# Patient Record
Sex: Male | Born: 1935 | Marital: Married | Smoking: Former smoker
Health system: Southern US, Community
[De-identification: ages and names within clinical notes are randomized; demographics above are authoritative.]

## PROBLEM LIST (undated history)

## (undated) DIAGNOSIS — C801 Malignant (primary) neoplasm, unspecified: Secondary | ICD-10-CM

## (undated) DIAGNOSIS — C189 Malignant neoplasm of colon, unspecified: Secondary | ICD-10-CM

## (undated) DIAGNOSIS — D649 Anemia, unspecified: Secondary | ICD-10-CM

## (undated) DIAGNOSIS — R55 Syncope and collapse: Principal | ICD-10-CM

## (undated) DIAGNOSIS — N4289 Other specified disorders of prostate: Secondary | ICD-10-CM

## (undated) HISTORY — PX: COLOSTOMY: SHX63

## (undated) HISTORY — PX: COLECTOMY: SHX59

## (undated) HISTORY — PX: CIRCUMCISION: SUR203

## (undated) HISTORY — PX: COLONOSCOPY: SHX174

---

## 2007-05-15 ENCOUNTER — Ambulatory Visit: Payer: Self-pay | Admitting: Family Medicine

## 2014-02-01 DIAGNOSIS — R55 Syncope and collapse: Principal | ICD-10-CM

## 2014-02-01 HISTORY — DX: Syncope and collapse: R55

## 2014-02-19 ENCOUNTER — Inpatient Hospital Stay (HOSPITAL_COMMUNITY)
Admission: EM | Admit: 2014-02-19 | Discharge: 2014-02-23 | DRG: 312 | Disposition: A | Discharge status: Home / Self Care | Payer: Medicare Other | Attending: Internal Medicine | Admitting: Internal Medicine

## 2014-02-19 ENCOUNTER — Emergency Department (HOSPITAL_COMMUNITY): Payer: Medicare Other

## 2014-02-19 ENCOUNTER — Encounter (HOSPITAL_COMMUNITY): Payer: Self-pay | Admitting: Emergency Medicine

## 2014-02-19 DIAGNOSIS — D649 Anemia, unspecified: Secondary | ICD-10-CM | POA: Diagnosis present

## 2014-02-19 DIAGNOSIS — S270XXA Traumatic pneumothorax, initial encounter: Secondary | ICD-10-CM | POA: Diagnosis present

## 2014-02-19 DIAGNOSIS — Z79899 Other long term (current) drug therapy: Secondary | ICD-10-CM

## 2014-02-19 DIAGNOSIS — R509 Fever, unspecified: Secondary | ICD-10-CM

## 2014-02-19 DIAGNOSIS — D72820 Lymphocytosis (symptomatic): Secondary | ICD-10-CM

## 2014-02-19 DIAGNOSIS — D696 Thrombocytopenia, unspecified: Secondary | ICD-10-CM | POA: Diagnosis present

## 2014-02-19 DIAGNOSIS — T148XXA Other injury of unspecified body region, initial encounter: Secondary | ICD-10-CM | POA: Diagnosis present

## 2014-02-19 DIAGNOSIS — Y9289 Other specified places as the place of occurrence of the external cause: Secondary | ICD-10-CM

## 2014-02-19 DIAGNOSIS — Z800 Family history of malignant neoplasm of digestive organs: Secondary | ICD-10-CM

## 2014-02-19 DIAGNOSIS — Y990 Civilian activity done for income or pay: Secondary | ICD-10-CM

## 2014-02-19 DIAGNOSIS — S2249XA Multiple fractures of ribs, unspecified side, initial encounter for closed fracture: Secondary | ICD-10-CM

## 2014-02-19 DIAGNOSIS — Z87891 Personal history of nicotine dependence: Secondary | ICD-10-CM

## 2014-02-19 DIAGNOSIS — R296 Repeated falls: Secondary | ICD-10-CM | POA: Diagnosis present

## 2014-02-19 DIAGNOSIS — D72821 Monocytosis (symptomatic): Secondary | ICD-10-CM | POA: Diagnosis present

## 2014-02-19 DIAGNOSIS — Z8711 Personal history of peptic ulcer disease: Secondary | ICD-10-CM

## 2014-02-19 DIAGNOSIS — J209 Acute bronchitis, unspecified: Secondary | ICD-10-CM | POA: Diagnosis present

## 2014-02-19 DIAGNOSIS — J440 Chronic obstructive pulmonary disease with acute lower respiratory infection: Secondary | ICD-10-CM | POA: Diagnosis present

## 2014-02-19 DIAGNOSIS — S2232XA Fracture of one rib, left side, initial encounter for closed fracture: Secondary | ICD-10-CM

## 2014-02-19 DIAGNOSIS — Z8249 Family history of ischemic heart disease and other diseases of the circulatory system: Secondary | ICD-10-CM

## 2014-02-19 DIAGNOSIS — J111 Influenza due to unidentified influenza virus with other respiratory manifestations: Secondary | ICD-10-CM | POA: Diagnosis present

## 2014-02-19 DIAGNOSIS — D539 Nutritional anemia, unspecified: Secondary | ICD-10-CM | POA: Diagnosis present

## 2014-02-19 DIAGNOSIS — M25559 Pain in unspecified hip: Secondary | ICD-10-CM | POA: Diagnosis present

## 2014-02-19 DIAGNOSIS — N400 Enlarged prostate without lower urinary tract symptoms: Secondary | ICD-10-CM | POA: Diagnosis present

## 2014-02-19 DIAGNOSIS — R55 Syncope and collapse: Principal | ICD-10-CM | POA: Diagnosis present

## 2014-02-19 HISTORY — DX: Anemia, unspecified: D64.9

## 2014-02-19 HISTORY — DX: Syncope and collapse: R55

## 2014-02-19 HISTORY — DX: Other specified disorders of prostate: N42.89

## 2014-02-19 LAB — BASIC METABOLIC PANEL
BUN: 16 mg/dL (ref 6–23)
CALCIUM: 8.6 mg/dL (ref 8.4–10.5)
CO2: 22 meq/L (ref 19–32)
CREATININE: 0.95 mg/dL (ref 0.50–1.35)
Chloride: 99 mEq/L (ref 96–112)
GFR calc Af Amer: >90 mL/min (ref >90)
GFR calc non Af Amer: 79 mL/min — ABNORMAL LOW (ref >90)
Glucose, Bld: 148 mg/dL — ABNORMAL HIGH (ref 70–99)
Potassium: 4.2 mEq/L (ref 3.7–5.3)
Sodium: 136 mEq/L — ABNORMAL LOW (ref 137–147)

## 2014-02-19 LAB — CBC
HEMATOCRIT: 29.9 % — AB (ref 39.0–52.0)
Hemoglobin: 9.7 g/dL — ABNORMAL LOW (ref 13.0–17.0)
MCH: 36.6 pg — AB (ref 26.0–34.0)
MCHC: 32.4 g/dL (ref 30.0–36.0)
MCV: 112.8 fL — AB (ref 78.0–100.0)
PLATELETS: 71 10*3/uL — AB (ref 150–400)
RBC: 2.65 MIL/uL — AB (ref 4.22–5.81)
RDW: 15.4 % (ref 11.5–15.5)
WBC: 4 10*3/uL (ref 4.0–10.5)

## 2014-02-19 LAB — RETICULOCYTES
RBC.: 2.39 MIL/uL — ABNORMAL LOW (ref 4.22–5.81)
RETIC COUNT ABSOLUTE: 43 10*3/uL (ref 19.0–186.0)
Retic Ct Pct: 1.8 % (ref 0.4–3.1)

## 2014-02-19 LAB — I-STAT CG4 LACTIC ACID, ED: Lactic Acid, Venous: 2.65 mmol/L — ABNORMAL HIGH (ref 0.5–2.2)

## 2014-02-19 LAB — POC OCCULT BLOOD, ED: Fecal Occult Bld: NEGATIVE

## 2014-02-19 LAB — I-STAT TROPONIN, ED: TROPONIN I, POC: 0.01 ng/mL (ref 0.00–0.08)

## 2014-02-19 LAB — TROPONIN I

## 2014-02-19 MED ORDER — SODIUM CHLORIDE 0.9 % IV BOLUS (SEPSIS)
1000.0000 mL | Freq: Once | INTRAVENOUS | Status: AC
  Administered 2014-02-19: 1000 mL via INTRAVENOUS

## 2014-02-19 MED ORDER — SODIUM CHLORIDE 0.9 % IJ SOLN
3.0000 mL | Freq: Two times a day (BID) | INTRAMUSCULAR | Status: DC
  Administered 2014-02-20 – 2014-02-22 (×5): 3 mL via INTRAVENOUS

## 2014-02-19 MED ORDER — FINASTERIDE 5 MG PO TABS
5.0000 mg | ORAL_TABLET | Freq: Every day | ORAL | Status: DC
  Administered 2014-02-20 – 2014-02-23 (×4): 5 mg via ORAL
  Filled 2014-02-19 (×4): qty 1

## 2014-02-19 MED ORDER — LEVOFLOXACIN IN D5W 750 MG/150ML IV SOLN
750.0000 mg | INTRAVENOUS | Status: DC
  Administered 2014-02-19 – 2014-02-21 (×3): 750 mg via INTRAVENOUS
  Filled 2014-02-19 (×4): qty 150

## 2014-02-19 MED ORDER — ACETAMINOPHEN 325 MG PO TABS
650.0000 mg | ORAL_TABLET | Freq: Four times a day (QID) | ORAL | Status: DC | PRN
  Administered 2014-02-20 – 2014-02-23 (×7): 650 mg via ORAL
  Filled 2014-02-19 (×8): qty 2

## 2014-02-19 MED ORDER — ACETAMINOPHEN 500 MG PO TABS
1000.0000 mg | ORAL_TABLET | Freq: Once | ORAL | Status: AC
  Administered 2014-02-19: 1000 mg via ORAL
  Filled 2014-02-19: qty 2

## 2014-02-19 MED ORDER — ONDANSETRON HCL 4 MG PO TABS: 4.0000 mg | ORAL_TABLET | Freq: Four times a day (QID) | ORAL | Status: DC | PRN

## 2014-02-19 MED ORDER — SODIUM CHLORIDE 0.9 % IV SOLN
INTRAVENOUS | Status: AC
  Administered 2014-02-19 – 2014-02-20 (×3): via INTRAVENOUS

## 2014-02-19 MED ORDER — IOHEXOL 300 MG/ML  SOLN
100.0000 mL | Freq: Once | INTRAMUSCULAR | Status: AC | PRN
  Administered 2014-02-19: 100 mL via INTRAVENOUS

## 2014-02-19 MED ORDER — ONDANSETRON HCL 4 MG/2ML IJ SOLN: 4.0000 mg | Freq: Four times a day (QID) | INTRAMUSCULAR | Status: DC | PRN

## 2014-02-19 MED ORDER — OXYCODONE HCL 5 MG PO TABS
5.0000 mg | ORAL_TABLET | Freq: Four times a day (QID) | ORAL | Status: DC | PRN
  Administered 2014-02-19 – 2014-02-23 (×10): 5 mg via ORAL
  Filled 2014-02-19 (×10): qty 1

## 2014-02-19 MED ORDER — ACETAMINOPHEN 650 MG RE SUPP: 650.0000 mg | Freq: Four times a day (QID) | RECTAL | Status: DC | PRN

## 2014-02-19 MED ORDER — TAMSULOSIN HCL 0.4 MG PO CAPS
0.4000 mg | ORAL_CAPSULE | Freq: Every day | ORAL | Status: DC
  Administered 2014-02-20 – 2014-02-23 (×4): 0.4 mg via ORAL
  Filled 2014-02-19 (×4): qty 1

## 2014-02-19 NOTE — H&P (Signed)
[facility] History and Physical  Paul Trettin JYN:[id number] DOB: 1936-02-06 DOA: 02/19/2014  Referring physician: ER physician. PCP: No PCP Per Patient  Chief Complaint: Loss of consciousness.  HPI: Nicolas [name] is a 78 y.o. male with history of benign prosthetic hypertrophy had a brief episode of loss of consciousness at the [employer] where he works. Patient states that was attending a funeral and suddenly passed out without any premonition. Patient denies any chest pain shortness of breath palpitations focal deficits headache prior or after the fall. Patient did not have any tongue bite or incontinence of urine. After he fell he hit his left mid back. States he also hit his head but has not had any laceration. In the ER patient's EKG showed normal sinus rhythm. Patient was found to be mildly febrile. CBC shows anemia but stool for occult blood was negative. Since patient is complaining of left mid back pain patient had a CT abdomen pelvis which shows left lower rib fracture with air trapping and hematoma. Patient has been admitted for further observation. Patient states he has not had any syncopal episodes prior. Over the last couple of weeks patient has been having productive cough and 2 weeks ago he was treated for UTI and prostate symptoms with one week of antibiotics and finasteride was added. Patient's family states that he has been recently stressed after his nephew died.  Review of Systems: As presented in the history of presenting illness, rest negative.  Past Medical History  Diagnosis Date  . Prostate atrophy   . Anemia   . Anemia    Past Surgical History  Procedure Laterality Date  . Circumcision    . Colonoscopy     Social History:  reports that he has quit smoking. He does not have any smokeless tobacco history on file. He reports that he does not drink alcohol or use illicit drugs. Where does patient live home. Can patient participate in ADLs? Yes.  No Known  Allergies  Family History:  Family History  Problem Relation Age of Onset  . CAD Mother   . Pancreatic cancer Father   . Stroke Neg Hx       Prior to Admission medications   Medication Sig Start Date End Date Taking? Authorizing Provider  acetaminophen (TYLENOL) 500 MG tablet Take 1,000 mg by mouth every 6 (six) hours as needed for mild pain.   Yes Historical Provider, MD  finasteride (PROSCAR) 5 MG tablet Take 5 mg by mouth daily. 02/17/14  Yes Historical Provider, MD  tamsulosin (FLOMAX) 0.4 MG CAPS capsule Take 0.4 mg by mouth daily. 02/17/14  Yes Historical Provider, MD    Physical Exam: Filed Vitals:   02/19/14 1915 02/19/14 1930 02/19/14 2100 02/19/14 2124  BP: 111/41 120/46 117/57 137/54  Pulse: 87 81 83 85  Temp:    99.3 F (37.4 C)  TempSrc:    Oral  Resp: 19 24 21 20   Height:    5\' 11"  (1.803 m)  Weight:    94.938 kg (209 lb 4.8 oz)  SpO2: 91% 94% 95% 94%     General:  Well-developed and nourished.  Eyes: Anicteric no pallor.  ENT: No discharge from the ears eyes nose mouth.  Neck: No JVD or mass felt.  Cardiovascular: S1-S2 heard.  Respiratory: No rhonchi or crepitations.  Abdomen: Soft nontender bowel sounds present.  Skin: There is skin bruise on the left midback. Mildly tender no active discharge.  Musculoskeletal: Tenderness in the left back area.  Psychiatric: Appears  normal.  Neurologic: Alert and oriented to time place and person. Moves all extremities 5 x 5. No facial asymmetry. Tongue is midline.  Labs on Admission:  Basic Metabolic Panel:  Recent Labs Lab 02/19/14 1820  NA 136*  K 4.2  CL 99  CO2 22  GLUCOSE 148*  BUN 16  CREATININE 0.95  CALCIUM 8.6   Liver Function Tests: No results found for this basename: AST, ALT, ALKPHOS, BILITOT, PROT, ALBUMIN,  in the last 168 hours No results found for this basename: LIPASE, AMYLASE,  in the last 168 hours No results found for this basename: AMMONIA,  in the last 168  hours CBC:  Recent Labs Lab 02/19/14 1911  WBC 4.0  HGB 9.7*  HCT 29.9*  MCV 112.8*  PLT 71*   Cardiac Enzymes: No results found for this basename: CKTOTAL, CKMB, CKMBINDEX, TROPONINI,  in the last 168 hours  BNP (last 3 results) No results found for this basename: PROBNP,  in the last 8760 hours CBG: No results found for this basename: GLUCAP,  in the last 168 hours  Radiological Exams on Admission: Dg Chest 2 View  02/19/2014   CLINICAL DATA:  Syncopal episode.  Fell.  EXAM: CHEST  2 VIEW  COMPARISON:  None.  FINDINGS: The cardiac silhouette, mediastinal and hilar contours are within normal limits. The lungs are clear. No pneumothorax. The bony thorax is intact.  IMPRESSION: No acute cardiopulmonary findings and intact bony thorax.   Electronically Signed   By: Kalman Jewels M.D.   On: 02/19/2014 19:04   Ct Abdomen Pelvis W Contrast  02/19/2014   CLINICAL DATA:  No surrounding hematoma or hemo peritoneum. There are small scattered hepatic cysts. No biliary dilatation. The gallbladder is normal. No common bowel duct dilatation. The pancreas is normal. The spleen is normal. No definite splenic laceration. The adrenal glands and kidneys are unremarkable. No renal lacerations.  EXAM: CT ABDOMEN AND PELVIS WITH CONTRAST  TECHNIQUE: Multidetector CT imaging of the abdomen and pelvis was performed using the standard protocol following bolus administration of intravenous contrast.  CONTRAST:  163mL OMNIPAQUE IOHEXOL 300 MG/ML  SOLN  COMPARISON:  None.  FINDINGS: The lung bases demonstrate bilateral pleural effusions and patchy atelectasis. No definite pneumothorax. Right lower lobe airspace process with peribronchial thickening. Could not exclude aspiration or pulmonary contusion. The heart is normal in size. No pericardial effusion.  There are multiple left posterior rib fractures. This involves the ninth, tenth, eleventh and twelfth ribs. There is air in the soft tissues along with hematoma  and soft tissue swelling. This could be due to an open laceration/penetrating wound or a pneumothorax that has sealed off.  There is an abnormality involving the posterior aspect of the right hepatic lobe. This is most likely 18 congenital defect or fold in the liver air. It falls along with the diaphragm and is a small amount of fat interposed. I do not think this is a liver laceration in is no subcapsular hematoma or intraperitoneal blood.  The stomach, duodenum, small bowel and colon are grossly normal. No mesenteric or retroperitoneal mass, adenopathy or hematoma. She aorta and branch vessels are normal. The major venous structures are patent.  The prostate gland is enlarged. The bladder appears normal. The seminal vesicles are normal. No pelvic mass, adenopathy or hematoma.  The bony pelvis is intact. Moderate SI joint degenerative changes. No lumbar spine compression fractures or spinous process fractures.  IMPRESSION: 1. Left lower posterior rib fractures with associated chest wall  hematoma. Air in the soft tissues may be due to a laceration, penetrating wound or pneumothorax that has sealed off. There is an associated left pleural effusion. 2. No acute intra-abdominal/pelvic injury. 3. 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 4. Prostate gland enlargement.   Electronically Signed   By: Kalman Jewels M.D.   On: 02/19/2014 20:25    EKG: Independently reviewed. Normal sinus rhythm.  Assessment/Plan Principal Problem:   Syncope Active Problems:   Hematoma   Fever   Macrocytic anemia   1. Syncope - cause not clear. Given the sudden onset and lack of prodrome should rule out arrhythmias. Closely monitor in telemetry. Check 2-D echo. Check orthostatics. CT head is pending. 2. Hematoma with left rib fracture and possible air-trapping - I have discussed with pulmonologist Dr. Lamonte Sakai. Dr. Lamonte Sakai has advised to get a CT chest and  closely observe. Pain medications. 3. Fever - probably respiratory. Check influenza PCR. Check UA. Pending blood cultures. Patient has been empirically placed on Levaquin. 4. Anemia, macrocytic - check anemia panel. Patient states he has been chronically anemic. Stool for occult blood negative. Patient has had previous history of peptic ulcer disease. 5. BPH - continue present medications.    Code Status: Full code.  Family Communication: Patient's wife at the bedside.  Disposition Plan: Admit for observation.    Stillman Buenger N. [facility] Pager [phone].  If 7PM-7AM, please contact night-coverage [email or web address] Password [facility] 02/19/2014, 10:32 PM

## 2014-02-19 NOTE — ED Notes (Addendum)
Report called to unit 3W.

## 2014-02-19 NOTE — ED Notes (Signed)
Patient given a meal bag. 

## 2014-02-19 NOTE — Progress Notes (Signed)
ANTIBIOTIC CONSULT NOTE - INITIAL  Pharmacy Consult for Levofloxacin Indication: Bronchitis/Sinusitis  No Known Allergies  Patient Measurements: Height: 5\' 11"  (180.3 cm) Weight: 209 lb 4.8 oz (94.938 kg) IBW/kg (Calculated) : 75.3  Vital Signs: Temp: 99.3 F (37.4 C) (03/19 2124) Temp src: Oral (03/19 2124) BP: 137/54 mmHg (03/19 2124) Pulse Rate: 85 (03/19 2124) Intake/Output from previous day:   Intake/Output from this shift: Total I/O In: -  Out: 150 [Urine:150]  Labs:  Recent Labs  02/19/14 1820 02/19/14 1911  WBC  --  4.0  HGB  --  9.7*  PLT  --  71*  CREATININE 0.95  --    Estimated Creatinine Clearance: 77.8 ml/min (by C-G formula based on Cr of 0.95). No results found for this basename: VANCOTROUGH, VANCOPEAK, VANCORANDOM, GENTTROUGH, GENTPEAK, GENTRANDOM, TOBRATROUGH, TOBRAPEAK, TOBRARND, AMIKACINPEAK, AMIKACINTROU, AMIKACIN,  in the last 72 hours   Microbiology: No results found for this or any previous visit (from the past 720 hour(s)).  Medical History: Past Medical History  Diagnosis Date  . Prostate atrophy   . Anemia   . Anemia     Medications:  Prescriptions prior to admission  Medication Sig Dispense Refill  . acetaminophen (TYLENOL) 500 MG tablet Take 1,000 mg by mouth every 6 (six) hours as needed for mild pain.      . finasteride (PROSCAR) 5 MG tablet Take 5 mg by mouth daily.      . tamsulosin (FLOMAX) 0.4 MG CAPS capsule Take 0.4 mg by mouth daily.       Assessment: 78 yo M admitted 02/19/2014  With syncope.  Pharmacy consulted to dose levofloxacin for bronchitis/sinusitis  Goal of Therapy:  Renal adjustment of antibiotics.  Plan:  1. Levofloxacin 750 mg IV q24h 2. Follow up SCr, UOP, cultures, clinical course and adjust as clinically indicated.   Thank you for allowing pharmacy to be a part of this patients care team.  Rowe Robert Pharm.D., BCPS Clinical Pharmacist 02/19/2014 10:41 PM Pager: [phone] Phone: [phone]

## 2014-02-19 NOTE — ED Provider Notes (Signed)
CSN: [id number]     Arrival date & time 02/19/14  1801 History   First MD Initiated Contact with Patient 02/19/14 1801     No chief complaint on file.    (Consider location/radiation/quality/duration/timing/severity/associated sxs/prior Treatment) Patient is a 78 y.o. male presenting with syncope. The history is provided by the patient.  Loss of Consciousness Episode history:  Single Most recent episode:  Today Duration:  1 minute Timing: once. Progression:  Resolved Chronicity:  New Context comment:  Prolonged standing Witnessed: yes   Relieved by:  Nothing Worsened by:  Nothing tried Associated symptoms: fever   Associated symptoms: no chest pain and no shortness of breath     Past Medical History  Diagnosis Date  . Prostate atrophy   Med Hx - BPH  History reviewed. No pertinent past surgical history.No prior surgeries History reviewed. No pertinent family history. History  Substance Use Topics  . Smoking status: Former Research scientist (life sciences)  . Smokeless tobacco: Not on file  . Alcohol Use: No    Review of Systems  Constitutional: Positive for fever. Negative for chills.  Respiratory: Negative for cough and shortness of breath.   Cardiovascular: Positive for syncope. Negative for chest pain and leg swelling.  All other systems reviewed and are negative.      Allergies  Review of patient's allergies indicates not on file.  Home Medications  No current outpatient prescriptions on file. There were no vitals taken for this visit. Physical Exam  Nursing note and vitals reviewed. Constitutional: He is oriented to person, place, and time. He appears well-developed and well-nourished. No distress.  HENT:  Head: Normocephalic and atraumatic.  Mouth/Throat: No oropharyngeal exudate.  Eyes: EOM are normal. Pupils are equal, round, and reactive to light.  Neck: Normal range of motion. Neck supple.  Cardiovascular: Normal rate and regular rhythm.  Exam reveals no friction rub.     No murmur heard. Pulmonary/Chest: Effort normal and breath sounds normal. No respiratory distress. He has no wheezes. He has no rales.  Abdominal: Soft. He exhibits no distension. There is tenderness (L flank). There is no rebound.  Musculoskeletal: Normal range of motion. He exhibits no edema.       Arms: Neurological: He is alert and oriented to person, place, and time. No cranial nerve deficit. He exhibits normal muscle tone. Coordination normal.  Skin: No rash noted. He is not diaphoretic.    ED Course  Procedures (including critical care time) Labs Review Labs Reviewed - No data to display Imaging Review Dg Chest 2 View  02/19/2014   CLINICAL DATA:  Syncopal episode.  Fell.  EXAM: CHEST  2 VIEW  COMPARISON:  None.  FINDINGS: The cardiac silhouette, mediastinal and hilar contours are within normal limits. The lungs are clear. No pneumothorax. The bony thorax is intact.  IMPRESSION: No acute cardiopulmonary findings and intact bony thorax.   Electronically Signed   By: Kalman Jewels M.D.   On: 02/19/2014 19:04     EKG Interpretation   Date/Time:  Thursday February 19 2014 18:13:59 EDT Ventricular Rate:  91 PR Interval:  166 QRS Duration: 85 QT Interval:  343 QTC Calculation: 422 R Axis:   20 Text Interpretation:  Sinus rhythm Abnormal R-wave progression, early  transition No prior to compare to Confirmed by Mingo Amber  MD, Wallace Ridge ([id number]) on  02/19/2014 6:26:34 PM      MDM   Final diagnoses:  Syncope  Fracture of rib of left side    69M s/p syncopal episode with  LOC for 1 minute. Was working, standing beside a gravestone. 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 L side hit footstone in the [place]. No prior medical problems. Patient here with fever, 100.9, normal BP. O2 sat 91% with EMS, 94% here on room air.  Neurologically intact, normal cranial nerves, normal strength and sensation.  EKG without ischemia. Will obtain labs. Patient markedly tender on L side s/p  fall. Will CT abdomen/pelvis. Will admit for syncope. CBC shows Hgb of 9.7, thrombocytopenia. Blood cultures drawn for fever. Patient reported hx of prior anemia, followed by the [place]. Dr. Hal Hope admitting. Hemoccult negative. Abrasion on back, tetanus UTD.  I have reviewed all labs and imaging and considered them in my medical decision making.   Osvaldo Shipper, MD 02/19/14 2040

## 2014-02-19 NOTE — ED Notes (Signed)
Patient arrived via [place] from the [place] where he had a syncopal episode lasting apprx a minute falling and hitting his lower left back on a headstone. Denies hitting his head or neck area. Patient has an abrasion to his lower left back. A/O, VSS.

## 2014-02-20 ENCOUNTER — Inpatient Hospital Stay (HOSPITAL_COMMUNITY): Payer: Medicare Other

## 2014-02-20 ENCOUNTER — Encounter (HOSPITAL_COMMUNITY): Payer: Self-pay | Admitting: Radiology

## 2014-02-20 DIAGNOSIS — S270XXA Traumatic pneumothorax, initial encounter: Secondary | ICD-10-CM

## 2014-02-20 DIAGNOSIS — C9110 Chronic lymphocytic leukemia of B-cell type not having achieved remission: Secondary | ICD-10-CM

## 2014-02-20 DIAGNOSIS — S2249XA Multiple fractures of ribs, unspecified side, initial encounter for closed fracture: Secondary | ICD-10-CM | POA: Diagnosis present

## 2014-02-20 DIAGNOSIS — S2239XA Fracture of one rib, unspecified side, initial encounter for closed fracture: Secondary | ICD-10-CM

## 2014-02-20 DIAGNOSIS — J111 Influenza due to unidentified influenza virus with other respiratory manifestations: Secondary | ICD-10-CM | POA: Diagnosis present

## 2014-02-20 DIAGNOSIS — I517 Cardiomegaly: Secondary | ICD-10-CM

## 2014-02-20 DIAGNOSIS — D61818 Other pancytopenia: Secondary | ICD-10-CM

## 2014-02-20 DIAGNOSIS — D649 Anemia, unspecified: Secondary | ICD-10-CM | POA: Diagnosis present

## 2014-02-20 DIAGNOSIS — J1189 Influenza due to unidentified influenza virus with other manifestations: Secondary | ICD-10-CM

## 2014-02-20 LAB — URINALYSIS, ROUTINE W REFLEX MICROSCOPIC
Bilirubin Urine: NEGATIVE
Glucose, UA: NEGATIVE mg/dL
Ketones, ur: NEGATIVE mg/dL
Leukocytes, UA: NEGATIVE
Nitrite: NEGATIVE
Protein, ur: NEGATIVE mg/dL
SPECIFIC GRAVITY, URINE: 1.02 (ref 1.005–1.030)
UROBILINOGEN UA: 1 mg/dL (ref 0.0–1.0)
pH: 5 (ref 5.0–8.0)

## 2014-02-20 LAB — CBC WITH DIFFERENTIAL/PLATELET
BASOS ABS: 0 10*3/uL (ref 0.0–0.1)
BLASTS: 0 %
Band Neutrophils: 21 % — ABNORMAL HIGH (ref 0–10)
Basophils Relative: 0 % (ref 0–1)
Eosinophils Absolute: 0 10*3/uL (ref 0.0–0.7)
Eosinophils Relative: 0 % (ref 0–5)
HCT: 28.2 % — ABNORMAL LOW (ref 39.0–52.0)
Hemoglobin: 9.2 g/dL — ABNORMAL LOW (ref 13.0–17.0)
Lymphocytes Relative: 5 % — ABNORMAL LOW (ref 12–46)
Lymphs Abs: 0.4 10*3/uL — ABNORMAL LOW (ref 0.7–4.0)
MCH: 36.5 pg — ABNORMAL HIGH (ref 26.0–34.0)
MCHC: 32.6 g/dL (ref 30.0–36.0)
MCV: 111.9 fL — ABNORMAL HIGH (ref 78.0–100.0)
MYELOCYTES: 0 %
Metamyelocytes Relative: 0 %
Monocytes Absolute: 5.4 10*3/uL — ABNORMAL HIGH (ref 0.1–1.0)
Monocytes Relative: 63 % — ABNORMAL HIGH (ref 3–12)
NEUTROS ABS: 2.8 10*3/uL (ref 1.7–7.7)
NEUTROS PCT: 11 % — AB (ref 43–77)
NRBC: 0 /100{WBCs}
PLATELETS: 73 10*3/uL — AB (ref 150–400)
PROMYELOCYTES ABS: 0 %
RBC: 2.52 MIL/uL — ABNORMAL LOW (ref 4.22–5.81)
RDW: 15.7 % — ABNORMAL HIGH (ref 11.5–15.5)
Smear Review: DECREASED
WBC: 8.6 10*3/uL (ref 4.0–10.5)

## 2014-02-20 LAB — INFLUENZA PANEL BY PCR (TYPE A & B)
H1N1 flu by pcr: NOT DETECTED
INFLBPCR: POSITIVE — AB
Influenza A By PCR: NEGATIVE

## 2014-02-20 LAB — COMPREHENSIVE METABOLIC PANEL
ALT: 11 U/L (ref 0–53)
AST: 23 U/L (ref 0–37)
Albumin: 3 g/dL — ABNORMAL LOW (ref 3.5–5.2)
Alkaline Phosphatase: 60 U/L (ref 39–117)
BILIRUBIN TOTAL: 0.8 mg/dL (ref 0.3–1.2)
BUN: 12 mg/dL (ref 6–23)
CHLORIDE: 99 meq/L (ref 96–112)
CO2: 22 meq/L (ref 19–32)
CREATININE: 0.76 mg/dL (ref 0.50–1.35)
Calcium: 7.9 mg/dL — ABNORMAL LOW (ref 8.4–10.5)
GFR calc Af Amer: >90 mL/min (ref >90)
GFR, EST NON AFRICAN AMERICAN: 86 mL/min — AB (ref >90)
GLUCOSE: 109 mg/dL — AB (ref 70–99)
Potassium: 3.8 mEq/L (ref 3.7–5.3)
SODIUM: 134 meq/L — AB (ref 137–147)
Total Protein: 6.8 g/dL (ref 6.0–8.3)

## 2014-02-20 LAB — TROPONIN I: Troponin I: <0.3 ng/mL (ref <0.30)

## 2014-02-20 LAB — LACTIC ACID, PLASMA: LACTIC ACID, VENOUS: 1.2 mmol/L (ref 0.5–2.2)

## 2014-02-20 LAB — FERRITIN: Ferritin: 359 ng/mL — ABNORMAL HIGH (ref 22–322)

## 2014-02-20 LAB — LACTATE DEHYDROGENASE: LDH: 269 U/L — ABNORMAL HIGH (ref 94–250)

## 2014-02-20 LAB — URINE MICROSCOPIC-ADD ON

## 2014-02-20 LAB — VITAMIN B12: Vitamin B-12: 383 pg/mL (ref 211–911)

## 2014-02-20 LAB — PATHOLOGIST SMEAR REVIEW

## 2014-02-20 LAB — IRON AND TIBC
IRON: 15 ug/dL — AB (ref 42–135)
Saturation Ratios: 7 % — ABNORMAL LOW (ref 20–55)
TIBC: 204 ug/dL — ABNORMAL LOW (ref 215–435)
UIBC: 189 ug/dL (ref 125–400)

## 2014-02-20 LAB — FOLATE: FOLATE: 14.5 ng/mL

## 2014-02-20 LAB — HAPTOGLOBIN: Haptoglobin: 107 mg/dL (ref 45–215)

## 2014-02-20 MED ORDER — HYDROCOD POLST-CHLORPHEN POLST 10-8 MG/5ML PO LQCR
5.0000 mL | Freq: Two times a day (BID) | ORAL | Status: DC | PRN
  Administered 2014-02-20: 5 mL via ORAL
  Filled 2014-02-20: qty 5

## 2014-02-20 MED ORDER — IPRATROPIUM-ALBUTEROL 0.5-2.5 (3) MG/3ML IN SOLN
3.0000 mL | RESPIRATORY_TRACT | Status: DC
Start: 2014-02-20 — End: 2014-02-20

## 2014-02-20 MED ORDER — IOHEXOL 350 MG/ML SOLN
100.0000 mL | Freq: Once | INTRAVENOUS | Status: AC | PRN
  Administered 2014-02-20: 100 mL via INTRAVENOUS

## 2014-02-20 MED ORDER — OSELTAMIVIR PHOSPHATE 75 MG PO CAPS
75.0000 mg | ORAL_CAPSULE | Freq: Two times a day (BID) | ORAL | Status: DC
  Administered 2014-02-20 – 2014-02-23 (×7): 75 mg via ORAL
  Filled 2014-02-20 (×8): qty 1

## 2014-02-20 MED ORDER — IPRATROPIUM-ALBUTEROL 0.5-2.5 (3) MG/3ML IN SOLN: 3.0000 mL | RESPIRATORY_TRACT | Status: DC

## 2014-02-20 MED ORDER — ALBUTEROL SULFATE (2.5 MG/3ML) 0.083% IN NEBU
INHALATION_SOLUTION | RESPIRATORY_TRACT | Status: AC
  Filled 2014-02-20: qty 3

## 2014-02-20 NOTE — Care Management (Signed)
1614 02-20-14 CM did call the [facility] and left Voice mail to make the [place] aware that the pt is here. No further needs from CM at this time. Bethena Roys, RN,BSN [phone]

## 2014-02-20 NOTE — Progress Notes (Signed)
UR Completed Nevaeh Korte Graves-Bigelow, RN,BSN [phone]  

## 2014-02-20 NOTE — Consult Note (Signed)
Nicolas [name]  Telephone:([phone]) Nicolas [name] NOTE  Nicolas [name]                                MR#: [id number]  DOB: 24-Jul-1936                       CSN#: [id number]  Referring MD: [facility]  Teaching Service  Dr.    Darl Householder MD: Dr. Tana Coast  Reason for Consult: Atypical lymphocytes, monocytosis   RJJ:OACZYS Nicolas [name] is a 78 y.o. male asked to see for evaluation of pancytopenia.  He was admitted on 02/19/14 after experiencing a syncopal episode while working at [employer] with subsequent injury to his lower left back,  trauma on a headstone resulting in loss of consciousness for about 1 minute. This event occurred following treatment for a urinary tract infection about 2 weeks ago with oral antibiotic for 1 week. CT of the head was negative. Additional workup including a CBC revealed an H/H of 9.7/29.9, WBC of 4.0 and platelets of 71k. MCV was elevated at 112.8. Retic was 43 manual. Moderate Hb was seen min UA. Cr and bili were normal.   Today, his laboratory values are as follows: H/H 9.2/28.2, WBC 8.6 and platelets 73k. MCV is 111.9. Mono was 5.4 absolute, ANC 2.8 and lymphs 0.4. There was a mild left shift was noted. Atypical lymphocytes were seen in smear. B12 was 383. Folic [name] and  Iron and TIBC pending . Ferritin 359 (?chronic)  Guaiac was negative. PCR was positive for Influenza B for which Tamiflu was initiated. homocysteine, methylmalonic acid, haptoglobin, LDH, intrinsic factor are pending. Labs from [facility] on November 25 2013 revealed WBC of 2.24, H/H of 11.7/36.8, MCV 113.9, Plt of 130 with 5 bands, 36 lymphs and 43 mono with 2+ Macroctyosis.   CT angio of the chest on 3/20 with contrast is negative for PE, but acute fractures of the left posterior 9th through 12th ribs with associated soft tissue gas and hematoma within the posterior left chest wall, not significantly changed relative to previous study. Small left-sided pneumothorax.  Moderate left with < right pleural effusion with associated bibasilar atelectasis and patchy and nodular opacities within the inferior right upper and right lower lobes and upper lobe predominant centrilobular emphysema. In addition, CT of the abdomen and pelvis with contrast was negative for acute intrapelvic injury.  Denies risk factors for HIV or hepatitis. No hemoptysis or epistaxis. No macroscopic blood in urine or in stool. No family history of hematological disorders. No gum bleed. No epistaxis or hemoptysis. Denies easy bruising. No  weight loss.  He was not on  ASA or NSAIDs.prior to admission.  No  Lovenox initiated in hospital.No family history of hematological disorders. He reports being followed by Dr. Briscoe Deutscher of [facility] hematology oncology.  He was last seen by Dr. Eugenie Birks in December/January, he reports with recommended observation for his blood disorder.  He could not recall the exact diagnosis.  He does however reports a bone marrow biopsy about 4-5 years ago.   We were kindly asked to see the patient with recommendations.   PMH:  Past Medical History  Diagnosis Date  . Prostate atrophy   . Anemia   . Anemia   . Syncope and collapse 02/2014    Surgeries:  Past Surgical History  Procedure Laterality Date  . Circumcision    .  Colonoscopy      Allergies: No Known Allergies  Medications:   Prior to Admission:  Prescriptions prior to admission  Medication Sig Dispense Refill  . acetaminophen (TYLENOL) 500 MG tablet Take 1,000 mg by mouth every 6 (six) hours as needed for mild pain.      . finasteride (PROSCAR) 5 MG tablet Take 5 mg by mouth daily.      . tamsulosin (FLOMAX) 0.4 MG CAPS capsule Take 0.4 mg by mouth daily.       Scheduled Meds: . albuterol      . finasteride  5 mg Oral Daily  . levofloxacin (LEVAQUIN) IV  750 mg Intravenous Q24H  . oseltamivir  75 mg Oral BID  . sodium chloride  3 mL Intravenous Q12H  . tamsulosin  0.4 mg Oral Daily    Continuous Infusions: . sodium chloride 125 mL/hr at 02/20/14 1001   PRN Meds:.acetaminophen, acetaminophen, chlorpheniramine-HYDROcodone, ondansetron (ZOFRAN) IV, ondansetron, oxyCODONE WER:[id number], acetaminophen, chlorpheniramine-HYDROcodone, ondansetron (ZOFRAN) IV, ondansetron, oxyCODONE  ROS: Constitutional: Positive for weight loss. Positive for fever up to 102, no , chills or  night sweats. Negative for  fatigue.  Eyes: Negative for blurred vision and double vision.  Respiratory: Negative for cough. No hemoptysis. No shortness of breath. No pleuritic chest pain.  Cardiovascular: Negative for chest pain. No palpitations.  GI: Negative for  nausea, vomiting, diarrhea or constipation. No change in bowel caliber. No  Melena or hematochezia. No abdominal pain.  GU: Negative for hematuria. No loss of urinary control.No urinary retention. Skin: Negative for itching. No rash. No petechia. No easy bruising.  Neurological: No motor or sensory deficits. Musculoskeletal: Left chest wall tenderness post fall.  Psych: patient dealing with loss of nephew last week, in great deal of stress.   Family History:    Family History  Problem Relation Age of Onset  . CAD Mother   . Pancreatic cancer Father   . Stroke Neg Hx     No family history of hematological  disorders.  Social History: .Married.  Lives in [place], [place]. 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  No alcohol or use illicit drugs   Physical Exam    Filed Vitals:   02/20/14 0542  BP: 137/52  Pulse: 91  Temp: 102.9 F (39.4 C)  Resp: 30    General: 78 y.o.white  male  in no acute distress A. and O. x3  well-developed and well-nourished.  HEENT: Normocephalic, atraumatic, PERRLA. Oral cavity without thrush or lesions. Neck supple. no thyromegaly, no cervical or supraclavicular adenopathy  Lungs mild decreased breath sounds at the  base. No wheezing,expiratory  rhonchi , no  rales.  No axillary masses. Left back bruising and tenderness post fall..  Breasts: not examined. Cardiac regular rate and rhythm, no murmur , rubs or gallops Abdomen soft nontender , bowel sounds x4. No hepatosplenomegaly. No masses palpable.  GU/rectal: deferred. Extremities no clubbing cyanosis or edema. No   petechial rash. No inguinal lymph nodes palpable.  Musculoskeletal: left flank tenderness with abrasion in the area after fall. Neuro: Non Focal.   Labs:    Recent Labs Lab 02/19/14 1911 02/20/14 0559  WBC 4.0 8.6  HGB 9.7* 9.2*  HCT 29.9* 28.2*  PLT 71* 73*  MCV 112.8* 111.9*  MCH 36.6* 36.5*  MCHC 32.4 32.6  RDW 15.4 15.7*  LYMPHSABS  --  0.4*  MONOABS  --  5.4*  EOSABS  --  0.0  BASOSABS  --  0.0       Recent Labs Lab 02/19/14 1820 02/20/14 0559  NA 136* 134*  K 4.2 3.8  CL 99 99  CO2 22 22  GLUCOSE 148* 109*  BUN 16 12  CREATININE 0.95 0.76  CALCIUM 8.6 7.9*  AST  --  23  ALT  --  11  ALKPHOS  --  60  BILITOT  --  0.8        Component Value Date/Time   BILITOT 0.8 02/20/2014 0559     Anemia panel:   Recent Labs  02/19/14 2254 02/20/14 0559  VITAMINB12  --  383  FOLATE  --  14.5  FERRITIN  --  359*  RETICCTPCT 1.8  --     Urinalysis    Component Value Date/Time   COLORURINE YELLOW 02/20/2014 0201   APPEARANCEUR CLEAR 02/20/2014 0201   LABSPEC 1.020 02/20/2014 0201   PHURINE 5.0 02/20/2014 0201   GLUCOSEU NEGATIVE 02/20/2014 0201   HGBUR MODERATE* 02/20/2014 0201   BILIRUBINUR NEGATIVE 02/20/2014 0201   KETONESUR NEGATIVE 02/20/2014 0201   PROTEINUR NEGATIVE 02/20/2014 0201   UROBILINOGEN 1.0 02/20/2014 0201   NITRITE NEGATIVE 02/20/2014 0201   LEUKOCYTESUR NEGATIVE 02/20/2014 0201    Drugs of Abuse  No results found for this basename: labopia, cocainscrnur, labbenz, amphetmu, thcu, labbarb      Imaging Studies:  Dg Chest 2 View  02/19/2014   CLINICAL DATA:  Syncopal episode.  Fell.  EXAM: CHEST  2 VIEW  COMPARISON:  None.   FINDINGS: The cardiac silhouette, mediastinal and hilar contours are within normal limits. The lungs are clear. No pneumothorax. The bony thorax is intact.  IMPRESSION: No acute cardiopulmonary findings and intact bony thorax.   Electronically Signed   By: Kalman Jewels M.D.   On: 02/19/2014 19:04   Ct Head Wo Contrast  02/20/2014   CLINICAL DATA:  Syncope  EXAM: CT HEAD WITHOUT CONTRAST  TECHNIQUE: Contiguous axial images were obtained from the base of the skull through the vertex without intravenous contrast.  COMPARISON:  None.  FINDINGS: Mild atrophy with chronic microvascular ischemic changes are present. Vascular calcifications present within the carotid siphons bilaterally.  There is no acute intracranial hemorrhage or infarct. No mass lesion or midline shift. Gray-white matter differentiation is well maintained. Ventricles are normal in size without evidence of hydrocephalus. No extra-axial fluid collection.  The calvarium is intact.  Orbital soft tissues are within normal limits.  The paranasal sinuses and mastoid air cells are well pneumatized and free of fluid.  Scalp soft tissues are unremarkable.  IMPRESSION: 1. No acute intracranial process. 2. Mild atrophy and chronic microvascular ischemic changes.   Electronically Signed   By: Jeannine Boga M.D.   On: 02/20/2014 06:11   Ct Angio Chest Pe W/cm &/or Wo Cm  02/20/2014   CLINICAL DATA:  Chest hematoma  EXAM: CT ANGIOGRAPHY CHEST WITH CONTRAST  TECHNIQUE: Multidetector CT imaging of the chest was performed using the standard protocol during bolus administration of intravenous contrast. Multiplanar CT image reconstructions and MIPs were obtained to evaluate the vascular anatomy.  CONTRAST:  154m OMNIPAQUE IOHEXOL 350 MG/ML SOLN  COMPARISON:  Prior radiograph and CT from 02/19/2014.  FINDINGS: No pathologically enlarged mediastinal, hilar, or axillary lymph nodes are identified.  Intrathoracic aorta is of normal caliber. Scattered  atherosclerotic calcifications seen within the aortic arch and the origin of the great vessels, most prevalent at the origin of the left subclavian artery. Heart size  is within normal limits. No pericardial effusion.  Pulmonary arterial tree is well opacified. No filling defects seen to suggest acute pulmonary embolism. Re-formatted imaging confirms these findings.  Moderate left with mild right pleural effusion is present there is associated bibasilar atelectasis. Opacities within the inferior right upper and lower right lower lobes No pulmonary edema. There is a small left-sided pneumothorax seen anteriorly (series 7, image 75).  Mild centrilobular emphysema is seen within the upper lobes bilaterally.  Hepatic cysts again noted, unchanged. Contour irregularity along the liver capsule overlying the posterior right hepatic lobe again noted, unchanged relative to prior CT. Remainder the partially visualized upper abdomen is unremarkable.  Acute fractures of the left posterior ninth through twelfth ribs again seen. There is adjacent soft tissue gas within the posterior chest wall way associated hematoma, grossly stable. No right-sided rib fractures identified.  Review of the MIP images confirms the above findings.  IMPRESSION: 1. No CT evidence of acute pulmonary embolism. 2. Acute fractures of the left posterior ninth through twelfth ribs with associated soft tissue gas and hematoma within the posterior left chest wall, not significantly changed relative to previous study. 3. Small left-sided pneumothorax as above. 4. Moderate left with smaller right pleural effusion with associated bibasilar atelectasis. 5. Patchy and nodular opacities within the inferior right upper and right lower lobes. While these findings may in part reflect atelectasis, possible endobronchial infection/aspiration could also be considered. 6. Upper lobe predominant centrilobular emphysema. Critical Value/emergent results were called by  telephone at the time of interpretation on 02/20/2014 at 6:03 AM to Dr. Gean Birchwood , who verbally acknowledged these results.   Electronically Signed   By: Jeannine Boga M.D.   On: 02/20/2014 06:05   Ct Abdomen Pelvis W Contrast  02/19/2014   CLINICAL DATA:  No surrounding hematoma or hemo peritoneum. There are small scattered hepatic cysts. No biliary dilatation. The gallbladder is normal. No common bowel duct dilatation. The pancreas is normal. The spleen is normal. No definite splenic laceration. The adrenal glands and kidneys are unremarkable. No renal lacerations.  EXAM: CT ABDOMEN AND PELVIS WITH CONTRAST  TECHNIQUE: Multidetector CT imaging of the abdomen and pelvis was performed using the standard protocol following bolus administration of intravenous contrast.  CONTRAST:  142m OMNIPAQUE IOHEXOL 300 MG/ML  SOLN  COMPARISON:  None.  FINDINGS: The lung bases demonstrate bilateral pleural effusions and patchy atelectasis. No definite pneumothorax. Right lower lobe airspace process with peribronchial thickening. Could not exclude aspiration or pulmonary contusion. The heart is normal in size. No pericardial effusion.  There are multiple left posterior rib fractures. This involves the ninth, tenth, eleventh and twelfth ribs. There is air in the soft tissues along with hematoma and soft tissue swelling. This could be due to an open laceration/penetrating wound or a pneumothorax that has sealed off.  There is an abnormality involving the posterior aspect of the right hepatic lobe. This is most likely 18 congenital defect or fold in the liver air. It falls along with the diaphragm and is a small amount of fat interposed. I do not think this is a liver laceration in is no subcapsular hematoma or intraperitoneal blood.  The stomach, duodenum, small bowel and colon are grossly normal. No mesenteric or retroperitoneal mass, adenopathy or hematoma. She aorta and branch vessels are normal. The major  venous structures are patent.  The prostate gland is enlarged. The bladder appears normal. The seminal vesicles are normal. No pelvic mass, adenopathy or hematoma.  The bony  pelvis is intact. Moderate SI joint degenerative changes. No lumbar spine compression fractures or spinous process fractures.  IMPRESSION: 1. Left lower posterior rib fractures with associated chest wall hematoma. Air in the soft tissues may be due to a laceration, penetrating wound or pneumothorax that has sealed off. There is an associated left pleural effusion. 2. No acute intra-abdominal/pelvic injury. 3. 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 4. Prostate gland enlargement.   Electronically Signed   By: Kalman Jewels M.D.   On: 02/19/2014 20:25   Dg Chest Port 1 View  02/20/2014   CLINICAL DATA:  Rib fractures, fever  EXAM: PORTABLE CHEST - 1 VIEW  COMPARISON:  02/20/2014 with  FINDINGS: Cardiomediastinal silhouette is unremarkable. Small left pleural effusion with left basilar atelectasis or infiltrate. Left posterior lower rib fractures are not identified by x-ray. No diagnostic pneumothorax.  IMPRESSION: Small left pleural effusion with left basilar atelectasis or infiltrate. No diagnostic pneumothorax.   Electronically Signed   By: Lahoma Crocker M.D.   On: 02/20/2014 10:10      A/P: 78 y.o. male with a past medical history as noted above asked to see for evaluation of atypical lymphocytes in the setting of Influenza B requiring Tamiflu, and levaquin,  following a UTI  Treated with oral antibiotics. Patient was admitted after a fall, likely orthostatic, with subsequent head trauma but no intracranial bleeding. Smear is remarkable for Mild left shift and  Atypical lymphocytes. Dr.Cataleah Stites   is to see the patient following this consult with recommendations regarding diagnosis and  further workup studies which may include a bone marrow biopsy if indicated to  rule out marrow process..Supportive transfusion may be recommended if no underlying risk, if platelets less than 10,000 or acute bleed, Neupogen until ANC>1 and RBC transfusion if Hb <8 or acutely bleeding.  Addendum to this note to be written by consulting MD.   Thank you for the referral.  Sharene Butters E, PA-C 02/20/2014 1:34 PM   ADDENDUM:  Patient seen and examined.  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   Recommendations: --Prior labs obtained and reviewed. Prior CBCs from last year (see hospital chart) also revealed atypical lymphocytes. Patient is being followed by Dr. Briscoe Deutscher of [facility], [address], [place] [address], [phone], Department of hematology and oncology --Please make his office aware of his admission (no answer presently).  Patient has had bone marrow biopsy and work-up.  He has a follow-up appointment with Dr. Eugenie Birks on April 14th. --Transfuse prn as noted above.   I personally saw this patient and performed a substantive portion of this encounter with the listed APP documented above.   Gaelan Glennon, MD Medical Oncology and Hematology

## 2014-02-20 NOTE — Progress Notes (Signed)
I was by the radiologist with regarding to the CT chest findings with a small pneumothorax. I had called on call pulmonologist. Pulmonary critical care at this time has requested to consult trauma team since patient has Rib fractures and hematoma in addition to pneumothorax.  Nicolas [name].

## 2014-02-20 NOTE — Consult Note (Signed)
Reason for Consult:Left rib fractures after fall Referring Physician: Dr. Meyer [name] is an 78 y.o. male.  HPI: 78 year old male, syncopal episode at funeral, hit left side on head stone.  5 minute LOC.  Has pain on the left side.  Past Medical History  Diagnosis Date  . Prostate atrophy   . Anemia   . Anemia     Past Surgical History  Procedure Laterality Date  . Circumcision    . Colonoscopy      Family History  Problem Relation Age of Onset  . CAD Mother   . Pancreatic cancer Father   . Stroke Neg Hx     Social History:  reports that he has quit smoking. He does not have any smokeless tobacco history on file. He reports that he does not drink alcohol or use illicit drugs.  Allergies: No Known Allergies  Medications:  Prior to Admission:  Prescriptions prior to admission  Medication Sig Dispense Refill  . acetaminophen (TYLENOL) 500 MG tablet Take 1,000 mg by mouth every 6 (six) hours as needed for mild pain.      . finasteride (PROSCAR) 5 MG tablet Take 5 mg by mouth daily.      . tamsulosin (FLOMAX) 0.4 MG CAPS capsule Take 0.4 mg by mouth daily.        Results for orders placed during the hospital encounter of 02/19/14 (from the past 48 hour(s))  BASIC METABOLIC PANEL     Status: Abnormal   Collection Time    02/19/14  6:20 PM      Result Value Ref Range   Sodium 136 (*) 137 - 147 mEq/L   Potassium 4.2  3.7 - 5.3 mEq/L   Chloride 99  96 - 112 mEq/L   CO2 22  19 - 32 mEq/L   Glucose, Bld 148 (*) 70 - 99 mg/dL   BUN 16  6 - 23 mg/dL   Creatinine, Ser 0.95  0.50 - 1.35 mg/dL   Calcium 8.6  8.4 - 10.5 mg/dL   GFR calc non Af Amer 79 (*) >90 mL/min   GFR calc Af Amer >90  >90 mL/min   Comment: (NOTE)     The eGFR has been calculated using the CKD EPI equation.     This calculation has not been validated in all clinical situations.     eGFR's persistently <90 mL/min signify possible Chronic Kidney     Disease.  Randolm Idol, ED     Status: None    Collection Time    02/19/14  6:29 PM      Result Value Ref Range   Troponin i, poc 0.01  0.00 - 0.08 ng/mL   Comment 3            Comment: Due to the release kinetics of cTnI,     a negative result within the first hours     of the onset of symptoms does not rule out     myocardial infarction with certainty.     If myocardial infarction is still suspected,     repeat the test at appropriate intervals.  I-STAT CG4 LACTIC ACID, ED     Status: Abnormal   Collection Time    02/19/14  6:31 PM      Result Value Ref Range   Lactic Acid, Venous 2.65 (*) 0.5 - 2.2 mmol/L  CBC     Status: Abnormal   Collection Time    02/19/14  7:11 PM  Result Value Ref Range   WBC 4.0  4.0 - 10.5 K/uL   RBC 2.65 (*) 4.22 - 5.81 MIL/uL   Hemoglobin 9.7 (*) 13.0 - 17.0 g/dL   HCT 29.9 (*) 39.0 - 52.0 %   MCV 112.8 (*) 78.0 - 100.0 fL   MCH 36.6 (*) 26.0 - 34.0 pg   MCHC 32.4  30.0 - 36.0 g/dL   RDW 15.4  11.5 - 15.5 %   Platelets 71 (*) 150 - 400 K/uL   Comment: REPEATED TO VERIFY     SPECIMEN CHECKED FOR CLOTS     PLATELET COUNT CONFIRMED BY SMEAR  POC OCCULT BLOOD, ED     Status: None   Collection Time    02/19/14  8:33 PM      Result Value Ref Range   Fecal Occult Bld NEGATIVE  NEGATIVE  TROPONIN I     Status: None   Collection Time    02/19/14 10:25 PM      Result Value Ref Range   Troponin I <0.30  <0.30 ng/mL   Comment:            Due to the release kinetics of cTnI,     a negative result within the first hours     of the onset of symptoms does not rule out     myocardial infarction with certainty.     If myocardial infarction is still suspected,     repeat the test at appropriate intervals.  RETICULOCYTES     Status: Abnormal   Collection Time    02/19/14 10:54 PM      Result Value Ref Range   Retic Ct Pct 1.8  0.4 - 3.1 %   RBC. 2.39 (*) 4.22 - 5.81 MIL/uL   Retic Count, Manual 43.0  19.0 - 186.0 K/uL  URINALYSIS, ROUTINE W REFLEX MICROSCOPIC     Status: Abnormal   Collection  Time    02/20/14  2:01 AM      Result Value Ref Range   Color, Urine YELLOW  YELLOW   APPearance CLEAR  CLEAR   Specific Gravity, Urine 1.020  1.005 - 1.030   pH 5.0  5.0 - 8.0   Glucose, UA NEGATIVE  NEGATIVE mg/dL   Hgb urine dipstick MODERATE (*) NEGATIVE   Bilirubin Urine NEGATIVE  NEGATIVE   Ketones, ur NEGATIVE  NEGATIVE mg/dL   Protein, ur NEGATIVE  NEGATIVE mg/dL   Urobilinogen, UA 1.0  0.0 - 1.0 mg/dL   Nitrite NEGATIVE  NEGATIVE   Leukocytes, UA NEGATIVE  NEGATIVE  URINE MICROSCOPIC-ADD ON     Status: None   Collection Time    02/20/14  2:01 AM      Result Value Ref Range   Squamous Epithelial / LPF RARE  RARE   RBC / HPF 3-6  <3 RBC/hpf   Bacteria, UA RARE  RARE  COMPREHENSIVE METABOLIC PANEL     Status: Abnormal   Collection Time    02/20/14  5:59 AM      Result Value Ref Range   Sodium 134 (*) 137 - 147 mEq/L   Potassium 3.8  3.7 - 5.3 mEq/L   Chloride 99  96 - 112 mEq/L   CO2 22  19 - 32 mEq/L   Glucose, Bld 109 (*) 70 - 99 mg/dL   BUN 12  6 - 23 mg/dL   Creatinine, Ser 0.76  0.50 - 1.35 mg/dL   Calcium 7.9 (*) 8.4 - 10.5 mg/dL  Total Protein 6.8  6.0 - 8.3 g/dL   Albumin 3.0 (*) 3.5 - 5.2 g/dL   AST 23  0 - 37 U/L   ALT 11  0 - 53 U/L   Alkaline Phosphatase 60  39 - 117 U/L   Total Bilirubin 0.8  0.3 - 1.2 mg/dL   GFR calc non Af Amer 86 (*) >90 mL/min   GFR calc Af Amer >90  >90 mL/min   Comment: (NOTE)     The eGFR has been calculated using the CKD EPI equation.     This calculation has not been validated in all clinical situations.     eGFR's persistently <90 mL/min signify possible Chronic Kidney     Disease.  CBC WITH DIFFERENTIAL     Status: Abnormal   Collection Time    02/20/14  5:59 AM      Result Value Ref Range   WBC 8.6  4.0 - 10.5 K/uL   RBC 2.52 (*) 4.22 - 5.81 MIL/uL   Hemoglobin 9.2 (*) 13.0 - 17.0 g/dL   HCT 28.2 (*) 39.0 - 52.0 %   MCV 111.9 (*) 78.0 - 100.0 fL   Comment: CONSISTENT WITH PREVIOUS RESULT   MCH 36.5 (*) 26.0 -  34.0 pg   MCHC 32.6  30.0 - 36.0 g/dL   RDW 15.7 (*) 11.5 - 15.5 %   Platelets 73 (*) 150 - 400 K/uL   Comment: SPECIMEN CHECKED FOR CLOTS     CONSISTENT WITH PREVIOUS RESULT   Neutrophils Relative % 11 (*) 43 - 77 %   Lymphocytes Relative 5 (*) 12 - 46 %   Monocytes Relative 63 (*) 3 - 12 %   Eosinophils Relative 0  0 - 5 %   Basophils Relative 0  0 - 1 %   Band Neutrophils 21 (*) 0 - 10 %   Metamyelocytes Relative 0     Myelocytes 0     Promyelocytes Absolute 0     Blasts 0     nRBC 0  0 /100 WBC   Neutro Abs 2.8  1.7 - 7.7 K/uL   Lymphs Abs 0.4 (*) 0.7 - 4.0 K/uL   Monocytes Absolute 5.4 (*) 0.1 - 1.0 K/uL   Eosinophils Absolute 0.0  0.0 - 0.7 K/uL   Basophils Absolute 0.0  0.0 - 0.1 K/uL   WBC Morphology MILD LEFT SHIFT (1-5% METAS, OCC MYELO, OCC BANDS)     Comment: ATYPICAL LYMPHOCYTES   Smear Review PLATELETS APPEAR DECREASED    LACTIC ACID, PLASMA     Status: None   Collection Time    02/20/14  5:59 AM      Result Value Ref Range   Lactic Acid, Venous 1.2  0.5 - 2.2 mmol/L    Dg Chest 2 View  02/19/2014   CLINICAL DATA:  Syncopal episode.  Fell.  EXAM: CHEST  2 VIEW  COMPARISON:  None.  FINDINGS: The cardiac silhouette, mediastinal and hilar contours are within normal limits. The lungs are clear. No pneumothorax. The bony thorax is intact.  IMPRESSION: No acute cardiopulmonary findings and intact bony thorax.   Electronically Signed   By: Kalman Jewels M.D.   On: 02/19/2014 19:04   Ct Head Wo Contrast  02/20/2014   CLINICAL DATA:  Syncope  EXAM: CT HEAD WITHOUT CONTRAST  TECHNIQUE: Contiguous axial images were obtained from the base of the skull through the vertex without intravenous contrast.  COMPARISON:  None.  FINDINGS: Mild atrophy with  chronic microvascular ischemic changes are present. Vascular calcifications present within the carotid siphons bilaterally.  There is no acute intracranial hemorrhage or infarct. No mass lesion or midline shift. Gray-white matter  differentiation is well maintained. Ventricles are normal in size without evidence of hydrocephalus. No extra-axial fluid collection.  The calvarium is intact.  Orbital soft tissues are within normal limits.  The paranasal sinuses and mastoid air cells are well pneumatized and free of fluid.  Scalp soft tissues are unremarkable.  IMPRESSION: 1. No acute intracranial process. 2. Mild atrophy and chronic microvascular ischemic changes.   Electronically Signed   By: Jeannine Boga M.D.   On: 02/20/2014 06:11   Ct Angio Chest Pe W/cm &/or Wo Cm  02/20/2014   CLINICAL DATA:  Chest hematoma  EXAM: CT ANGIOGRAPHY CHEST WITH CONTRAST  TECHNIQUE: Multidetector CT imaging of the chest was performed using the standard protocol during bolus administration of intravenous contrast. Multiplanar CT image reconstructions and MIPs were obtained to evaluate the vascular anatomy.  CONTRAST:  173m OMNIPAQUE IOHEXOL 350 MG/ML SOLN  COMPARISON:  Prior radiograph and CT from 02/19/2014.  FINDINGS: No pathologically enlarged mediastinal, hilar, or axillary lymph nodes are identified.  Intrathoracic aorta is of normal caliber. Scattered atherosclerotic calcifications seen within the aortic arch and the origin of the great vessels, most prevalent at the origin of the left subclavian artery. Heart size is within normal limits. No pericardial effusion.  Pulmonary arterial tree is well opacified. No filling defects seen to suggest acute pulmonary embolism. Re-formatted imaging confirms these findings.  Moderate left with mild right pleural effusion is present there is associated bibasilar atelectasis. Opacities within the inferior right upper and lower right lower lobes No pulmonary edema. There is a small left-sided pneumothorax seen anteriorly (series 7, image 75).  Mild centrilobular emphysema is seen within the upper lobes bilaterally.  Hepatic cysts again noted, unchanged. Contour irregularity along the liver capsule overlying the  posterior right hepatic lobe again noted, unchanged relative to prior CT. Remainder the partially visualized upper abdomen is unremarkable.  Acute fractures of the left posterior ninth through twelfth ribs again seen. There is adjacent soft tissue gas within the posterior chest wall way associated hematoma, grossly stable. No right-sided rib fractures identified.  Review of the MIP images confirms the above findings.  IMPRESSION: 1. No CT evidence of acute pulmonary embolism. 2. Acute fractures of the left posterior ninth through twelfth ribs with associated soft tissue gas and hematoma within the posterior left chest wall, not significantly changed relative to previous study. 3. Small left-sided pneumothorax as above. 4. Moderate left with smaller right pleural effusion with associated bibasilar atelectasis. 5. Patchy and nodular opacities within the inferior right upper and right lower lobes. While these findings may in part reflect atelectasis, possible endobronchial infection/aspiration could also be considered. 6. Upper lobe predominant centrilobular emphysema. Critical Value/emergent results were called by telephone at the time of interpretation on 02/20/2014 at 6:03 AM to Dr. AGean Birchwood, who verbally acknowledged these results.   Electronically Signed   By: BJeannine BogaM.D.   On: 02/20/2014 06:05   Ct Abdomen Pelvis W Contrast  02/19/2014   CLINICAL DATA:  No surrounding hematoma or hemo peritoneum. There are small scattered hepatic cysts. No biliary dilatation. The gallbladder is normal. No common bowel duct dilatation. The pancreas is normal. The spleen is normal. No definite splenic laceration. The adrenal glands and kidneys are unremarkable. No renal lacerations.  EXAM: CT ABDOMEN AND PELVIS WITH  CONTRAST  TECHNIQUE: Multidetector CT imaging of the abdomen and pelvis was performed using the standard protocol following bolus administration of intravenous contrast.  CONTRAST:  112m  OMNIPAQUE IOHEXOL 300 MG/ML  SOLN  COMPARISON:  None.  FINDINGS: The lung bases demonstrate bilateral pleural effusions and patchy atelectasis. No definite pneumothorax. Right lower lobe airspace process with peribronchial thickening. Could not exclude aspiration or pulmonary contusion. The heart is normal in size. No pericardial effusion.  There are multiple left posterior rib fractures. This involves the ninth, tenth, eleventh and twelfth ribs. There is air in the soft tissues along with hematoma and soft tissue swelling. This could be due to an open laceration/penetrating wound or a pneumothorax that has sealed off.  There is an abnormality involving the posterior aspect of the right hepatic lobe. This is most likely 18 congenital defect or fold in the liver air. It falls along with the diaphragm and is a small amount of fat interposed. I do not think this is a liver laceration in is no subcapsular hematoma or intraperitoneal blood.  The stomach, duodenum, small bowel and colon are grossly normal. No mesenteric or retroperitoneal mass, adenopathy or hematoma. She aorta and branch vessels are normal. The major venous structures are patent.  The prostate gland is enlarged. The bladder appears normal. The seminal vesicles are normal. No pelvic mass, adenopathy or hematoma.  The bony pelvis is intact. Moderate SI joint degenerative changes. No lumbar spine compression fractures or spinous process fractures.  IMPRESSION: 1. Left lower posterior rib fractures with associated chest wall hematoma. Air in the soft tissues may be due to a laceration, penetrating wound or pneumothorax that has sealed off. There is an associated left pleural effusion. 2. No acute intra-abdominal/pelvic injury. 3. 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 4. Prostate gland enlargement.   Electronically Signed   By: MKalman JewelsM.D.   On: 02/19/2014 20:25     Review of Systems  Constitutional: Positive for fever.  All other systems reviewed and are negative.   Blood pressure 137/52, pulse 91, temperature 102.9 F (39.4 C), temperature source Oral, resp. rate 30, height 5' 11"  (1.803 m), weight 94.938 kg (209 lb 4.8 oz), SpO2 92.00%. Physical Exam  Constitutional: He is oriented to person, place, and time. He appears well-developed and well-nourished.  HENT:  Head: Normocephalic and atraumatic.  Eyes: Conjunctivae and EOM are normal. Pupils are equal, round, and reactive to light.  Cardiovascular: Normal rate, regular rhythm and normal heart sounds.   Respiratory: Effort normal and breath sounds normal. No respiratory distress. He exhibits bony tenderness. He exhibits no crepitus. Left breast exhibits tenderness.  GI: Soft. Bowel sounds are normal.  Musculoskeletal: Normal range of motion.  Neurological: He is alert and oriented to person, place, and time. He has normal reflexes.  Skin: Skin is warm and dry.  Psychiatric: He has a normal mood and affect. His behavior is normal. Judgment and thought content normal.    Assessment/Plan: Ground level fall onto hard object Left 9-12 rib fractures with small (miniscule) left PTX Pain)  The patient was able to get IS up to 1250 when I assessed the patient.  His pain appears to be well controlled on current regimen.  No CXR today.  Has had fever to 102.9 and a cough.  Will repeat the CXR although I would be surprised with the patient's ability to take a deep breath  and use the IS if this were the source of his fever.  Pain control and IS usage hourly is the mainstay of treatment for these rib fractures.  We will follow.  Gwenyth Ober 02/20/2014, 9:05 AM

## 2014-02-20 NOTE — Progress Notes (Signed)
Patient ID: Nicolas [name]  male  ZOX:[id number]    DOB: September 06, 1936    DOA: 02/19/2014  PCP: No PCP Per Patient  Assessment/Plan: Principal Problem:   Syncope: Likely orthostatic, versus cardiac versus due to acute illness with influenza and respiratory symptoms -  1 set of cardiac enzymes negative, follow 2-D echo, continue IV fluid hydration   Active Problems:  Fever with URI and influenza, acute bronchitis, COPD: Positive for influenza B. - Continue incentive spirometry, Levaquin IV, Tamiflu started     Multiple rib fractures with left-sided pleuritic chest pain, small left pneumothorax  - Fractures from left posterior ninth through 12th ribs - Appreciate trauma team for seeing the patient, will continue pain control - Repeated the chest x-ray by trauma team, shows no diagnostic pneumothorax - incentive spirometry    Macrocytic anemia with anemia and thrombocytopenia - Reviewed the CBC with differential and smear with the pathologist- patient has marked monocytosis with atypical and immature forms, myeloids, with left shift, macrocytic anemia and thrombocytopenia  - B12 of 383, folate normal, ordered homocystine, methylmalonic acid, haptoglobin, LDH, intrinsic factor - Patient will likely need bone marrow exam however it is not clear if it is reactive due to acute illness versus chronic. D/w Dr Juliann Mule for hematology consult - Will obtain his records from [place] regarding the previous CBC with differential within the last 1 year - FOBT negative  BPH  - Continue Flomax   DVT Prophylaxis:SCDs   Code Status:Full code   Family Communication:  Disposition:  Consultants:  Trauma, Dr. Hulen Skains    hematology, Dr. Juliann Mule  Procedures:  None   Antibiotics:  Levaquin 3/19>>  Tamiflu 3/20>    Subjective: Patient seen and examined, complaining of left-sided pleuritic chest/back pain, coughing  Objective: Weight change:   Intake/Output Summary (Last 24 hours) at 02/20/14  1255 Last data filed at 02/20/14 1100  Gross per 24 hour  Intake   1095 ml  Output   1050 ml  Net     45 ml   Blood pressure 137/52, pulse 91, temperature 102.9 F (39.4 C), temperature source Oral, resp. rate 30, height 5\' 11"  (1.803 m), weight 94.938 kg (209 lb 4.8 oz), SpO2 92.00%.  Physical Exam: General: Alert and awake, oriented x3, not in any acute distress. CVS: S1-S2 clear, no murmur rubs or gallops Chest: clear to auscultation bilaterally, no wheezing, rales or rhonchi, left chest wall tenderness  Abdomen: soft nontender, nondistended, normal bowel sounds  Extremities: no cyanosis, clubbing or edema noted bilaterally Neuro: Cranial nerves II-XII intact, no focal neurological deficits  Lab Results: Basic Metabolic Panel:  Recent Labs Lab 02/19/14 1820 02/20/14 0559  NA 136* 134*  K 4.2 3.8  CL 99 99  CO2 22 22  GLUCOSE 148* 109*  BUN 16 12  CREATININE 0.95 0.76  CALCIUM 8.6 7.9*   Liver Function Tests:  Recent Labs Lab 02/20/14 0559  AST 23  ALT 11  ALKPHOS 60  BILITOT 0.8  PROT 6.8  ALBUMIN 3.0*   No results found for this basename: LIPASE, AMYLASE,  in the last 168 hours No results found for this basename: AMMONIA,  in the last 168 hours CBC:  Recent Labs Lab 02/19/14 1911 02/20/14 0559  WBC 4.0 8.6  NEUTROABS  --  2.8  HGB 9.7* 9.2*  HCT 29.9* 28.2*  MCV 112.8* 111.9*  PLT 71* 73*   Cardiac Enzymes:  Recent Labs Lab 02/19/14 2225  TROPONINI <0.30   BNP: No components found with  this basename: POCBNP,  CBG: No results found for this basename: GLUCAP,  in the last 168 hours   Micro Results: No results found for this or any previous visit (from the past 240 hour(s)).  Studies/Results: Dg Chest 2 View  02/19/2014   CLINICAL DATA:  Syncopal episode.  Fell.  EXAM: CHEST  2 VIEW  COMPARISON:  None.  FINDINGS: The cardiac silhouette, mediastinal and hilar contours are within normal limits. The lungs are clear. No pneumothorax. The  bony thorax is intact.  IMPRESSION: No acute cardiopulmonary findings and intact bony thorax.   Electronically Signed   By: Kalman Jewels M.D.   On: 02/19/2014 19:04   Ct Head Wo Contrast  02/20/2014   CLINICAL DATA:  Syncope  EXAM: CT HEAD WITHOUT CONTRAST  TECHNIQUE: Contiguous axial images were obtained from the base of the skull through the vertex without intravenous contrast.  COMPARISON:  None.  FINDINGS: Mild atrophy with chronic microvascular ischemic changes are present. Vascular calcifications present within the carotid siphons bilaterally.  There is no acute intracranial hemorrhage or infarct. No mass lesion or midline shift. Gray-white matter differentiation is well maintained. Ventricles are normal in size without evidence of hydrocephalus. No extra-axial fluid collection.  The calvarium is intact.  Orbital soft tissues are within normal limits.  The paranasal sinuses and mastoid air cells are well pneumatized and free of fluid.  Scalp soft tissues are unremarkable.  IMPRESSION: 1. No acute intracranial process. 2. Mild atrophy and chronic microvascular ischemic changes.   Electronically Signed   By: Jeannine Boga M.D.   On: 02/20/2014 06:11   Ct Angio Chest Pe W/cm &/or Wo Cm  02/20/2014   CLINICAL DATA:  Chest hematoma  EXAM: CT ANGIOGRAPHY CHEST WITH CONTRAST  TECHNIQUE: Multidetector CT imaging of the chest was performed using the standard protocol during bolus administration of intravenous contrast. Multiplanar CT image reconstructions and MIPs were obtained to evaluate the vascular anatomy.  CONTRAST:  140mL OMNIPAQUE IOHEXOL 350 MG/ML SOLN  COMPARISON:  Prior radiograph and CT from 02/19/2014.  FINDINGS: No pathologically enlarged mediastinal, hilar, or axillary lymph nodes are identified.  Intrathoracic aorta is of normal caliber. Scattered atherosclerotic calcifications seen within the aortic arch and the origin of the great vessels, most prevalent at the origin of the left  subclavian artery. Heart size is within normal limits. No pericardial effusion.  Pulmonary arterial tree is well opacified. No filling defects seen to suggest acute pulmonary embolism. Re-formatted imaging confirms these findings.  Moderate left with mild right pleural effusion is present there is associated bibasilar atelectasis. Opacities within the inferior right upper and lower right lower lobes No pulmonary edema. There is a small left-sided pneumothorax seen anteriorly (series 7, image 75).  Mild centrilobular emphysema is seen within the upper lobes bilaterally.  Hepatic cysts again noted, unchanged. Contour irregularity along the liver capsule overlying the posterior right hepatic lobe again noted, unchanged relative to prior CT. Remainder the partially visualized upper abdomen is unremarkable.  Acute fractures of the left posterior ninth through twelfth ribs again seen. There is adjacent soft tissue gas within the posterior chest wall way associated hematoma, grossly stable. No right-sided rib fractures identified.  Review of the MIP images confirms the above findings.  IMPRESSION: 1. No CT evidence of acute pulmonary embolism. 2. Acute fractures of the left posterior ninth through twelfth ribs with associated soft tissue gas and hematoma within the posterior left chest wall, not significantly changed relative to previous study. 3. Small  left-sided pneumothorax as above. 4. Moderate left with smaller right pleural effusion with associated bibasilar atelectasis. 5. Patchy and nodular opacities within the inferior right upper and right lower lobes. While these findings may in part reflect atelectasis, possible endobronchial infection/aspiration could also be considered. 6. Upper lobe predominant centrilobular emphysema. Critical Value/emergent results were called by telephone at the time of interpretation on 02/20/2014 at 6:03 AM to Dr. Gean Birchwood , who verbally acknowledged these results.    Electronically Signed   By: Jeannine Boga M.D.   On: 02/20/2014 06:05   Ct Abdomen Pelvis W Contrast  02/19/2014   CLINICAL DATA:  No surrounding hematoma or hemo peritoneum. There are small scattered hepatic cysts. No biliary dilatation. The gallbladder is normal. No common bowel duct dilatation. The pancreas is normal. The spleen is normal. No definite splenic laceration. The adrenal glands and kidneys are unremarkable. No renal lacerations.  EXAM: CT ABDOMEN AND PELVIS WITH CONTRAST  TECHNIQUE: Multidetector CT imaging of the abdomen and pelvis was performed using the standard protocol following bolus administration of intravenous contrast.  CONTRAST:  147mL OMNIPAQUE IOHEXOL 300 MG/ML  SOLN  COMPARISON:  None.  FINDINGS: The lung bases demonstrate bilateral pleural effusions and patchy atelectasis. No definite pneumothorax. Right lower lobe airspace process with peribronchial thickening. Could not exclude aspiration or pulmonary contusion. The heart is normal in size. No pericardial effusion.  There are multiple left posterior rib fractures. This involves the ninth, tenth, eleventh and twelfth ribs. There is air in the soft tissues along with hematoma and soft tissue swelling. This could be due to an open laceration/penetrating wound or a pneumothorax that has sealed off.  There is an abnormality involving the posterior aspect of the right hepatic lobe. This is most likely 18 congenital defect or fold in the liver air. It falls along with the diaphragm and is a small amount of fat interposed. I do not think this is a liver laceration in is no subcapsular hematoma or intraperitoneal blood.  The stomach, duodenum, small bowel and colon are grossly normal. No mesenteric or retroperitoneal mass, adenopathy or hematoma. She aorta and branch vessels are normal. The major venous structures are patent.  The prostate gland is enlarged. The bladder appears normal. The seminal vesicles are normal. No pelvic mass,  adenopathy or hematoma.  The bony pelvis is intact. Moderate SI joint degenerative changes. No lumbar spine compression fractures or spinous process fractures.  IMPRESSION: 1. Left lower posterior rib fractures with associated chest wall hematoma. Air in the soft tissues may be due to a laceration, penetrating wound or pneumothorax that has sealed off. There is an associated left pleural effusion. 2. No acute intra-abdominal/pelvic injury. 3. 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 4. Prostate gland enlargement.   Electronically Signed   By: Kalman Jewels M.D.   On: 02/19/2014 20:25   Dg Chest Port 1 View  02/20/2014   CLINICAL DATA:  Rib fractures, fever  EXAM: PORTABLE CHEST - 1 VIEW  COMPARISON:  02/20/2014 with  FINDINGS: Cardiomediastinal silhouette is unremarkable. Small left pleural effusion with left basilar atelectasis or infiltrate. Left posterior lower rib fractures are not identified by x-ray. No diagnostic pneumothorax.  IMPRESSION: Small left pleural effusion with left basilar atelectasis or infiltrate. No diagnostic pneumothorax.   Electronically Signed   By: Lahoma Crocker M.D.   On: 02/20/2014 10:10    Medications: Scheduled Meds: .  albuterol      . finasteride  5 mg Oral Daily  . levofloxacin (LEVAQUIN) IV  750 mg Intravenous Q24H  . oseltamivir  75 mg Oral BID  . sodium chloride  3 mL Intravenous Q12H  . tamsulosin  0.4 mg Oral Daily      LOS: 1 day   Doyt Castellana M.D. [facility] 02/20/2014, 12:55 PM Pager: [phone]  If 7PM-7AM, please contact night-coverage [email or web address] Password [email or web address]

## 2014-02-20 NOTE — Progress Notes (Signed)
Called to do treatment for patient having some breathing difficulties, after assessing patient, no wheezes noted but a good amount of coarse rhonchi. Patient has trouble coughing with broken ribs post his fall on 3/19. Patient has no respiratory history other than smoking 1 pak day and quit 35 years ago. Teaching flutter and IS to help patient move air and cough out secretions, also getting him another pillow to use for splinting. DC'd treatments at this time but will have respiratory reassess in two days to see how progress is coming.

## 2014-02-20 NOTE — Progress Notes (Signed)
Pt is + PCR for influenza B.  Pt informed, and MD Dr Tana Coast notified.

## 2014-02-20 NOTE — Progress Notes (Signed)
Echocardiogram 2D Echocardiogram has been performed.  Nicolas [name] 02/20/2014, 10:18 AM

## 2014-02-20 NOTE — Care Management Note (Unsigned)
    Page 1 of 1   02/20/2014     2:15:20 PM   CARE MANAGEMENT NOTE 02/20/2014  Patient:  Nicolas [name], Nicolas [name]   Account Number:  [id number]  Date Initiated:  02/20/2014  Documentation initiated by:  GRAVES-BIGELOW,Chai Routh  Subjective/Objective Assessment:   Pt admitted for syncope/ fall-Multiple rib fractures with left-sided pleuritic chest pain, small left pneumothorax.     Action/Plan:   CM will continue to monitor for disposition needs.   Anticipated DC Date:  02/23/2014   Anticipated DC Plan:  [place]  CM consult      Choice offered to / List presented to:             Status of service:  In process, will continue to follow Medicare Important Message given?   (If response is "NO", the following Medicare IM given date fields will be blank) Date Medicare IM given:   Date Additional Medicare IM given:    Discharge Disposition:    Per UR Regulation:  Reviewed for med. necessity/level of care/duration of stay  If discussed at [place] of Stay Meetings, dates discussed:    Comments:

## 2014-02-21 ENCOUNTER — Inpatient Hospital Stay (HOSPITAL_COMMUNITY): Payer: Medicare Other

## 2014-02-21 DIAGNOSIS — D72820 Lymphocytosis (symptomatic): Secondary | ICD-10-CM

## 2014-02-21 LAB — URINE CULTURE
COLONY COUNT: NO GROWTH
CULTURE: NO GROWTH

## 2014-02-21 LAB — CBC
HEMATOCRIT: 26.1 % — AB (ref 39.0–52.0)
Hemoglobin: 8.3 g/dL — ABNORMAL LOW (ref 13.0–17.0)
MCH: 36.2 pg — AB (ref 26.0–34.0)
MCHC: 31.8 g/dL (ref 30.0–36.0)
MCV: 114 fL — AB (ref 78.0–100.0)
Platelets: 62 10*3/uL — ABNORMAL LOW (ref 150–400)
RBC: 2.29 MIL/uL — ABNORMAL LOW (ref 4.22–5.81)
RDW: 15.7 % — AB (ref 11.5–15.5)
WBC: 5.2 10*3/uL (ref 4.0–10.5)

## 2014-02-21 LAB — BASIC METABOLIC PANEL
BUN: 8 mg/dL (ref 6–23)
CHLORIDE: 104 meq/L (ref 96–112)
CO2: 25 mEq/L (ref 19–32)
CREATININE: 0.71 mg/dL (ref 0.50–1.35)
Calcium: 7.9 mg/dL — ABNORMAL LOW (ref 8.4–10.5)
GFR calc non Af Amer: 89 mL/min — ABNORMAL LOW (ref >90)
Glucose, Bld: 89 mg/dL (ref 70–99)
Potassium: 3.8 mEq/L (ref 3.7–5.3)
Sodium: 139 mEq/L (ref 137–147)

## 2014-02-21 LAB — HOMOCYSTEINE: HOMOCYSTEINE-NORM: 5.5 umol/L (ref 4.0–15.4)

## 2014-02-21 MED ORDER — POLYETHYLENE GLYCOL 3350 17 G PO PACK
17.0000 g | PACK | Freq: Every day | ORAL | Status: DC
  Filled 2014-02-21 (×3): qty 1

## 2014-02-21 NOTE — Progress Notes (Signed)
Patient ID: Amith Noblett  male  [id number]    DOB: 02-09-1936    DOA: 02/19/2014  PCP: No PCP Per Patient  Assessment/Plan:  Syncope: Likely orthostatic, versus cardiac versus due to acute illness with influenza and respiratory symptoms -  Troponin times 3 negative. 2-D echo; EF 55%.  Fever with URI and influenza, acute bronchitis, COPD: Positive for influenza B. - Continue incentive spirometry, Levaquin IV, Tamiflu started  Multiple rib fractures with left-sided pleuritic chest pain, small left pneumothorax  - Fractures from left posterior ninth through 12th ribs - Appreciate trauma team for seeing the patient, will continue pain control - Repeated the chest x-ray by trauma team, shows no diagnostic pneumothorax - incentive spirometry  Macrocytic anemia with anemia and thrombocytopenia - Reviewed the CBC with differential and smear with the pathologist- patient has marked monocytosis with atypical and immature forms, myeloids, with left shift, macrocytic anemia and thrombocytopenia  - B12 of 383, folate normal, ordered homocystine, methylmalonic acid, haptoglobin, LDH, intrinsic factor - Appreciate  Dr Juliann Mule evaluation. Patient to follow up with primary hematologist at discharge.   Left hip pain:  check x ray.   - FOBT negative  BPH  - Continue Flomax   DVT Prophylaxis:SCDs   Code Status:Full code   Family Communication:  Disposition:PT, OT consult.   Consultants:  Trauma, Dr. Hulen Skains    hematology, Dr. Juliann Mule  Procedures:  None   Antibiotics:  Levaquin 3/19>>  Tamiflu 3/20>    Subjective: Still complaining of left hip pain. Complaining of ribs pain.  Objective: Weight change: 0.44 kg (15.5 oz)  Intake/Output Summary (Last 24 hours) at 02/21/14 1058 Last data filed at 02/21/14 [id number]  Gross per 24 hour  Intake    240 ml  Output   2400 ml  Net  -2160 ml   Blood pressure 146/67, pulse 79, temperature 97.9 F (36.6 C), temperature source Oral, resp.  rate 18, height 5\' 11"  (1.803 m), weight 94.788 kg (208 lb 15.5 oz), SpO2 90.00%.  Physical Exam: General: Alert and awake, oriented x3, not in any acute distress. CVS: S1-S2 clear, no murmur rubs or gallops Chest: clear to auscultation bilaterally.  Abdomen: soft nontender, nondistended, normal bowel sounds  Extremities: no cyanosis, clubbing or edema noted bilaterally   Lab Results: Basic Metabolic Panel:  Recent Labs Lab 02/20/14 0559 02/21/14 0336  NA 134* 139  K 3.8 3.8  CL 99 104  CO2 22 25  GLUCOSE 109* 89  BUN 12 8  CREATININE 0.76 0.71  CALCIUM 7.9* 7.9*   Liver Function Tests:  Recent Labs Lab 02/20/14 0559  AST 23  ALT 11  ALKPHOS 60  BILITOT 0.8  PROT 6.8  ALBUMIN 3.0*   No results found for this basename: LIPASE, AMYLASE,  in the last 168 hours No results found for this basename: AMMONIA,  in the last 168 hours CBC:  Recent Labs Lab 02/20/14 0559 02/21/14 0336  WBC 8.6 5.2  NEUTROABS 2.8  --   HGB 9.2* 8.3*  HCT 28.2* 26.1*  MCV 111.9* 114.0*  PLT 73* 62*   Cardiac Enzymes:  Recent Labs Lab 02/19/14 2225 02/20/14 1313 02/20/14 1929  TROPONINI <0.30 <0.30 <0.30   BNP: No components found with this basename: POCBNP,  CBG: No results found for this basename: GLUCAP,  in the last 168 hours   Micro Results: Recent Results (from the past 240 hour(s))  URINE CULTURE     Status: None   Collection Time    02/20/14  2:01 AM      Result Value Ref Range Status   Specimen Description URINE, RANDOM   Final   Special Requests NONE   Final   Culture  Setup Time     Final   Value: 02/20/2014 09:04     Performed at [place]     Final   Value: NO GROWTH     Performed at [facility]   Culture     Final   Value: NO GROWTH     Performed at [facility]   Report Status 02/21/2014 FINAL   Final    Studies/Results: Dg Chest 2 View  02/19/2014   CLINICAL DATA:  Syncopal episode.  Fell.  EXAM:  CHEST  2 VIEW  COMPARISON:  None.  FINDINGS: The cardiac silhouette, mediastinal and hilar contours are within normal limits. The lungs are clear. No pneumothorax. The bony thorax is intact.  IMPRESSION: No acute cardiopulmonary findings and intact bony thorax.   Electronically Signed   By: Kalman Jewels M.D.   On: 02/19/2014 19:04   Ct Head Wo Contrast  02/20/2014   CLINICAL DATA:  Syncope  EXAM: CT HEAD WITHOUT CONTRAST  TECHNIQUE: Contiguous axial images were obtained from the base of the skull through the vertex without intravenous contrast.  COMPARISON:  None.  FINDINGS: Mild atrophy with chronic microvascular ischemic changes are present. Vascular calcifications present within the carotid siphons bilaterally.  There is no acute intracranial hemorrhage or infarct. No mass lesion or midline shift. Gray-white matter differentiation is well maintained. Ventricles are normal in size without evidence of hydrocephalus. No extra-axial fluid collection.  The calvarium is intact.  Orbital soft tissues are within normal limits.  The paranasal sinuses and mastoid air cells are well pneumatized and free of fluid.  Scalp soft tissues are unremarkable.  IMPRESSION: 1. No acute intracranial process. 2. Mild atrophy and chronic microvascular ischemic changes.   Electronically Signed   By: Jeannine Boga M.D.   On: 02/20/2014 06:11   Ct Angio Chest Pe W/cm &/or Wo Cm  02/20/2014   CLINICAL DATA:  Chest hematoma  EXAM: CT ANGIOGRAPHY CHEST WITH CONTRAST  TECHNIQUE: Multidetector CT imaging of the chest was performed using the standard protocol during bolus administration of intravenous contrast. Multiplanar CT image reconstructions and MIPs were obtained to evaluate the vascular anatomy.  CONTRAST:  114mL OMNIPAQUE IOHEXOL 350 MG/ML SOLN  COMPARISON:  Prior radiograph and CT from 02/19/2014.  FINDINGS: No pathologically enlarged mediastinal, hilar, or axillary lymph nodes are identified.  Intrathoracic aorta is of  normal caliber. Scattered atherosclerotic calcifications seen within the aortic arch and the origin of the great vessels, most prevalent at the origin of the left subclavian artery. Heart size is within normal limits. No pericardial effusion.  Pulmonary arterial tree is well opacified. No filling defects seen to suggest acute pulmonary embolism. Re-formatted imaging confirms these findings.  Moderate left with mild right pleural effusion is present there is associated bibasilar atelectasis. Opacities within the inferior right upper and lower right lower lobes No pulmonary edema. There is a small left-sided pneumothorax seen anteriorly (series 7, image 75).  Mild centrilobular emphysema is seen within the upper lobes bilaterally.  Hepatic cysts again noted, unchanged. Contour irregularity along the liver capsule overlying the posterior right hepatic lobe again noted, unchanged relative to prior CT. Remainder the partially visualized upper abdomen is unremarkable.  Acute fractures of the left posterior ninth through twelfth ribs again seen.  There is adjacent soft tissue gas within the posterior chest wall way associated hematoma, grossly stable. No right-sided rib fractures identified.  Review of the MIP images confirms the above findings.  IMPRESSION: 1. No CT evidence of acute pulmonary embolism. 2. Acute fractures of the left posterior ninth through twelfth ribs with associated soft tissue gas and hematoma within the posterior left chest wall, not significantly changed relative to previous study. 3. Small left-sided pneumothorax as above. 4. Moderate left with smaller right pleural effusion with associated bibasilar atelectasis. 5. Patchy and nodular opacities within the inferior right upper and right lower lobes. While these findings may in part reflect atelectasis, possible endobronchial infection/aspiration could also be considered. 6. Upper lobe predominant centrilobular emphysema. Critical Value/emergent  results were called by telephone at the time of interpretation on 02/20/2014 at 6:03 AM to Dr. Gean Birchwood , who verbally acknowledged these results.   Electronically Signed   By: Jeannine Boga M.D.   On: 02/20/2014 06:05   Ct Abdomen Pelvis W Contrast  02/19/2014   CLINICAL DATA:  No surrounding hematoma or hemo peritoneum. There are small scattered hepatic cysts. No biliary dilatation. The gallbladder is normal. No common bowel duct dilatation. The pancreas is normal. The spleen is normal. No definite splenic laceration. The adrenal glands and kidneys are unremarkable. No renal lacerations.  EXAM: CT ABDOMEN AND PELVIS WITH CONTRAST  TECHNIQUE: Multidetector CT imaging of the abdomen and pelvis was performed using the standard protocol following bolus administration of intravenous contrast.  CONTRAST:  159mL OMNIPAQUE IOHEXOL 300 MG/ML  SOLN  COMPARISON:  None.  FINDINGS: The lung bases demonstrate bilateral pleural effusions and patchy atelectasis. No definite pneumothorax. Right lower lobe airspace process with peribronchial thickening. Could not exclude aspiration or pulmonary contusion. The heart is normal in size. No pericardial effusion.  There are multiple left posterior rib fractures. This involves the ninth, tenth, eleventh and twelfth ribs. There is air in the soft tissues along with hematoma and soft tissue swelling. This could be due to an open laceration/penetrating wound or a pneumothorax that has sealed off.  There is an abnormality involving the posterior aspect of the right hepatic lobe. This is most likely 18 congenital defect or fold in the liver air. It falls along with the diaphragm and is a small amount of fat interposed. I do not think this is a liver laceration in is no subcapsular hematoma or intraperitoneal blood.  The stomach, duodenum, small bowel and colon are grossly normal. No mesenteric or retroperitoneal mass, adenopathy or hematoma. She aorta and branch vessels are  normal. The major venous structures are patent.  The prostate gland is enlarged. The bladder appears normal. The seminal vesicles are normal. No pelvic mass, adenopathy or hematoma.  The bony pelvis is intact. Moderate SI joint degenerative changes. No lumbar spine compression fractures or spinous process fractures.  IMPRESSION: 1. Left lower posterior rib fractures with associated chest wall hematoma. Air in the soft tissues may be due to a laceration, penetrating wound or pneumothorax that has sealed off. There is an associated left pleural effusion. 2. No acute intra-abdominal/pelvic injury. 3. 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 4. Prostate gland enlargement.   Electronically Signed   By: Kalman Jewels M.D.   On: 02/19/2014 20:25   Dg Chest Port 1 View  02/20/2014   CLINICAL DATA:  Rib fractures, fever  EXAM: PORTABLE CHEST -  1 VIEW  COMPARISON:  02/20/2014 with  FINDINGS: Cardiomediastinal silhouette is unremarkable. Small left pleural effusion with left basilar atelectasis or infiltrate. Left posterior lower rib fractures are not identified by x-ray. No diagnostic pneumothorax.  IMPRESSION: Small left pleural effusion with left basilar atelectasis or infiltrate. No diagnostic pneumothorax.   Electronically Signed   By: Lahoma Crocker M.D.   On: 02/20/2014 10:10    Medications: Scheduled Meds: . finasteride  5 mg Oral Daily  . levofloxacin (LEVAQUIN) IV  750 mg Intravenous Q24H  . oseltamivir  75 mg Oral BID  . polyethylene glycol  17 g Oral Daily  . sodium chloride  3 mL Intravenous Q12H  . tamsulosin  0.4 mg Oral Daily      LOS: 2 days   Niel Hummer A M.D. [facility] 02/21/2014, 10:58 AM Pager: [phone]  If 7PM-7AM, please contact night-coverage [email or web address] Password [email or web address]

## 2014-02-21 NOTE — Progress Notes (Signed)
Patient ID: Nicolas [name], male   DOB: [birth year], 78 y.o.   MRN: [id number]  LOS: 2 days   Subjective: Pain a bit better, using IS and flutter valve.  He is lying in bed, discussed getting oob with assist.   Denies sob. Objective: Vital signs in last 24 hours: Temp:  [97.9 F (36.6 C)-99.4 F (37.4 C)] 97.9 F (36.6 C) (03/21 0814) Pulse Rate:  [59-79] 79 (03/21 0814) Resp:  [18] 18 (03/21 0814) BP: (111-146)/(51-67) 146/67 mmHg (03/21 0814) SpO2:  [90 %-97 %] 90 % (03/21 0814) Weight:  [208 lb 15.5 oz (94.788 kg)] 208 lb 15.5 oz (94.788 kg) (03/21 0438)    Lab Results:  CBC  Recent Labs  02/20/14 0559 02/21/14 0336  WBC 8.6 5.2  HGB 9.2* 8.3*  HCT 28.2* 26.1*  PLT 73* 62*   BMET  Recent Labs  02/20/14 0559 02/21/14 0336  NA 134* 139  K 3.8 3.8  CL 99 104  CO2 22 25  GLUCOSE 109* 89  BUN 12 8  CREATININE 0.76 0.71  CALCIUM 7.9* 7.9*    Imaging: Dg Chest 2 View  02/19/2014   CLINICAL DATA:  Syncopal episode.  Fell.  EXAM: CHEST  2 VIEW  COMPARISON:  None.  FINDINGS: The cardiac silhouette, mediastinal and hilar contours are within normal limits. The lungs are clear. No pneumothorax. The bony thorax is intact.  IMPRESSION: No acute cardiopulmonary findings and intact bony thorax.   Electronically Signed   By: Kalman Jewels M.D.   On: 02/19/2014 19:04   Ct Head Wo Contrast  02/20/2014   CLINICAL DATA:  Syncope  EXAM: CT HEAD WITHOUT CONTRAST  TECHNIQUE: Contiguous axial images were obtained from the base of the skull through the vertex without intravenous contrast.  COMPARISON:  None.  FINDINGS: Mild atrophy with chronic microvascular ischemic changes are present. Vascular calcifications present within the carotid siphons bilaterally.  There is no acute intracranial hemorrhage or infarct. No mass lesion or midline shift. Gray-white matter differentiation is well maintained. Ventricles are normal in size without evidence of hydrocephalus. No extra-axial fluid collection.   The calvarium is intact.  Orbital soft tissues are within normal limits.  The paranasal sinuses and mastoid air cells are well pneumatized and free of fluid.  Scalp soft tissues are unremarkable.  IMPRESSION: 1. No acute intracranial process. 2. Mild atrophy and chronic microvascular ischemic changes.   Electronically Signed   By: Jeannine Boga M.D.   On: 02/20/2014 06:11   Ct Angio Chest Pe W/cm &/or Wo Cm  02/20/2014   CLINICAL DATA:  Chest hematoma  EXAM: CT ANGIOGRAPHY CHEST WITH CONTRAST  TECHNIQUE: Multidetector CT imaging of the chest was performed using the standard protocol during bolus administration of intravenous contrast. Multiplanar CT image reconstructions and MIPs were obtained to evaluate the vascular anatomy.  CONTRAST:  118mL OMNIPAQUE IOHEXOL 350 MG/ML SOLN  COMPARISON:  Prior radiograph and CT from 02/19/2014.  FINDINGS: No pathologically enlarged mediastinal, hilar, or axillary lymph nodes are identified.  Intrathoracic aorta is of normal caliber. Scattered atherosclerotic calcifications seen within the aortic arch and the origin of the great vessels, most prevalent at the origin of the left subclavian artery. Heart size is within normal limits. No pericardial effusion.  Pulmonary arterial tree is well opacified. No filling defects seen to suggest acute pulmonary embolism. Re-formatted imaging confirms these findings.  Moderate left with mild right pleural effusion is present there is associated bibasilar atelectasis. Opacities within the inferior right upper  and lower right lower lobes No pulmonary edema. There is a small left-sided pneumothorax seen anteriorly (series 7, image 75).  Mild centrilobular emphysema is seen within the upper lobes bilaterally.  Hepatic cysts again noted, unchanged. Contour irregularity along the liver capsule overlying the posterior right hepatic lobe again noted, unchanged relative to prior CT. Remainder the partially visualized upper abdomen is  unremarkable.  Acute fractures of the left posterior ninth through twelfth ribs again seen. There is adjacent soft tissue gas within the posterior chest wall way associated hematoma, grossly stable. No right-sided rib fractures identified.  Review of the MIP images confirms the above findings.  IMPRESSION: 1. No CT evidence of acute pulmonary embolism. 2. Acute fractures of the left posterior ninth through twelfth ribs with associated soft tissue gas and hematoma within the posterior left chest wall, not significantly changed relative to previous study. 3. Small left-sided pneumothorax as above. 4. Moderate left with smaller right pleural effusion with associated bibasilar atelectasis. 5. Patchy and nodular opacities within the inferior right upper and right lower lobes. While these findings may in part reflect atelectasis, possible endobronchial infection/aspiration could also be considered. 6. Upper lobe predominant centrilobular emphysema. Critical Value/emergent results were called by telephone at the time of interpretation on 02/20/2014 at 6:03 AM to Dr. Gean Birchwood , who verbally acknowledged these results.   Electronically Signed   By: Jeannine Boga M.D.   On: 02/20/2014 06:05   Ct Abdomen Pelvis W Contrast  02/19/2014   CLINICAL DATA:  No surrounding hematoma or hemo peritoneum. There are small scattered hepatic cysts. No biliary dilatation. The gallbladder is normal. No common bowel duct dilatation. The pancreas is normal. The spleen is normal. No definite splenic laceration. The adrenal glands and kidneys are unremarkable. No renal lacerations.  EXAM: CT ABDOMEN AND PELVIS WITH CONTRAST  TECHNIQUE: Multidetector CT imaging of the abdomen and pelvis was performed using the standard protocol following bolus administration of intravenous contrast.  CONTRAST:  132mL OMNIPAQUE IOHEXOL 300 MG/ML  SOLN  COMPARISON:  None.  FINDINGS: The lung bases demonstrate bilateral pleural effusions and patchy  atelectasis. No definite pneumothorax. Right lower lobe airspace process with peribronchial thickening. Could not exclude aspiration or pulmonary contusion. The heart is normal in size. No pericardial effusion.  There are multiple left posterior rib fractures. This involves the ninth, tenth, eleventh and twelfth ribs. There is air in the soft tissues along with hematoma and soft tissue swelling. This could be due to an open laceration/penetrating wound or a pneumothorax that has sealed off.  There is an abnormality involving the posterior aspect of the right hepatic lobe. This is most likely 18 congenital defect or fold in the liver air. It falls along with the diaphragm and is a small amount of fat interposed. I do not think this is a liver laceration in is no subcapsular hematoma or intraperitoneal blood.  The stomach, duodenum, small bowel and colon are grossly normal. No mesenteric or retroperitoneal mass, adenopathy or hematoma. She aorta and branch vessels are normal. The major venous structures are patent.  The prostate gland is enlarged. The bladder appears normal. The seminal vesicles are normal. No pelvic mass, adenopathy or hematoma.  The bony pelvis is intact. Moderate SI joint degenerative changes. No lumbar spine compression fractures or spinous process fractures.  IMPRESSION: 1. Left lower posterior rib fractures with associated chest wall hematoma. Air in the soft tissues may be due to a laceration, penetrating wound or pneumothorax that has sealed off.  There is an associated left pleural effusion. 2. No acute intra-abdominal/pelvic injury. 3. 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 4. Prostate gland enlargement.   Electronically Signed   By: Kalman Jewels M.D.   On: 02/19/2014 20:25   Dg Chest Port 1 View  02/20/2014   CLINICAL DATA:  Rib fractures, fever  EXAM: PORTABLE CHEST - 1 VIEW  COMPARISON:  02/20/2014 with   FINDINGS: Cardiomediastinal silhouette is unremarkable. Small left pleural effusion with left basilar atelectasis or infiltrate. Left posterior lower rib fractures are not identified by x-ray. No diagnostic pneumothorax.  IMPRESSION: Small left pleural effusion with left basilar atelectasis or infiltrate. No diagnostic pneumothorax.   Electronically Signed   By: Lahoma Crocker M.D.   On: 02/20/2014 10:10    PE: General appearance: alert, cooperative and no distress Resp: clear to auscultation bilaterally   Patient Active Problem List   Diagnosis Date Noted  . Influenza with other respiratory manifestations 02/20/2014  . Pneumothorax, traumatic 02/20/2014  . Fracture of multiple ribs 02/20/2014  . Anemia 02/20/2014  . Syncope 02/19/2014  . Hematoma 02/19/2014  . Fever 02/19/2014  . Macrocytic anemia 02/19/2014    Assessment/Plan: Ground level fall onto hard object  Left 9-12 rib fractures with small (miniscule) left PTX  Pain) Repeat CXR shows a resolved PTX  Recommend adequate pain control with PO pain meds Aggressive pulmonary toilet-IS and flutter valve, reinforced use with the patient NEEDS MOBILIZATION as well as sitting up in a chair Trauma signing off.  Please do not hesitate to contact the trauma services with any questions or concerns.    Erby Pian, ANP-BC Pager: [phone] General Trauma PA Pager: [phone]   02/21/2014 11:12 AM

## 2014-02-22 DIAGNOSIS — J111 Influenza due to unidentified influenza virus with other respiratory manifestations: Secondary | ICD-10-CM

## 2014-02-22 LAB — CBC
HEMATOCRIT: 27.3 % — AB (ref 39.0–52.0)
Hemoglobin: 8.8 g/dL — ABNORMAL LOW (ref 13.0–17.0)
MCH: 36.2 pg — ABNORMAL HIGH (ref 26.0–34.0)
MCHC: 32.2 g/dL (ref 30.0–36.0)
MCV: 112.3 fL — ABNORMAL HIGH (ref 78.0–100.0)
Platelets: 72 10*3/uL — ABNORMAL LOW (ref 150–400)
RBC: 2.43 MIL/uL — ABNORMAL LOW (ref 4.22–5.81)
RDW: 15.3 % (ref 11.5–15.5)
WBC: 3.3 10*3/uL — ABNORMAL LOW (ref 4.0–10.5)

## 2014-02-22 LAB — BASIC METABOLIC PANEL
BUN: 8 mg/dL (ref 6–23)
CO2: 25 mEq/L (ref 19–32)
Calcium: 8.3 mg/dL — ABNORMAL LOW (ref 8.4–10.5)
Chloride: 103 mEq/L (ref 96–112)
Creatinine, Ser: 0.62 mg/dL (ref 0.50–1.35)
GFR calc Af Amer: >90 mL/min (ref >90)
GFR calc non Af Amer: >90 mL/min (ref >90)
Glucose, Bld: 103 mg/dL — ABNORMAL HIGH (ref 70–99)
Potassium: 4.1 mEq/L (ref 3.7–5.3)
Sodium: 138 mEq/L (ref 137–147)

## 2014-02-22 MED ORDER — LEVOFLOXACIN 750 MG PO TABS
750.0000 mg | ORAL_TABLET | Freq: Every day | ORAL | Status: DC
Start: 2014-02-22 — End: 2014-02-23
  Administered 2014-02-22 – 2014-02-23 (×2): 750 mg via ORAL
  Filled 2014-02-22 (×2): qty 1

## 2014-02-22 NOTE — Evaluation (Signed)
Physical Therapy Evaluation Patient Details Name: Nicolas [name] MRN: [id number] DOB: 11-Sep-1936 Today's Date: 02/22/2014 Time: 3154-0086 PT Time Calculation (min): 23 min  PT Assessment / Plan / Recommendation History of Present Illness  78 y.o. male admitted to [facility] on 02/19/14 with syncopal episode.  Found to have 9-12 rib fractures with pneumothorax and (+) flu.   Clinical Impression  Pt is sore and moving slowly with gait.  He is needing external support while he walks due to pain and increased DOE.  O2 sats remained stable on RA despite DOE 3/4 requiring multiple standing rest breaks.  Pt will likely progress well enough to go home using his cane and not need HHPT f/u at discharge.   PT to follow acutely for deficits listed below.       PT Assessment  Patient needs continued PT services    Follow Up Recommendations  No PT follow up;Supervision - Intermittent    Does the patient have the potential to tolerate intense rehabilitation     NA  Barriers to Discharge   None      Equipment Recommendations  None recommended by PT (pt has a cane at home)    Recommendations for Other Services   None  Frequency Min 3X/week    Precautions / Restrictions Precautions Precautions: Other (comment) Precaution Comments: rib fractures (brace with pillow to cough/sneeze) and gentle with gait belt.    Pertinent Vitals/Pain  02/22/14 1031  Vital Signs  Pulse Rate 77  Pain Assessment  Pain Assessment 0-10  Pain Score 7  Pain Type Acute pain  Pain Location Rib cage  Pain Orientation Left  Pain Intervention(s) Repositioned;Ambulation/increased activity  Oxygen Therapy  SpO2 93 %  O2 Device None (Room air)    02/22/14 1057  Vital Signs  Pulse Rate 91  Oxygen Therapy  SpO2 96 % (DOE 3/4 with gait)  O2 Device None (Room air)         Mobility  Bed Mobility Overal bed mobility: Needs Assistance Bed Mobility: Rolling;Sidelying to Sit Rolling: Modified independent (Device/Increase  time) (with bed rail) Sidelying to sit: Min assist General bed mobility comments: Min assist to support trunk to get to sitting from right side lying.  Pt relying heavily on bed rail and grimacing throughout transition.  Assist needed to support his trunk.  Transfers Overall transfer level: Needs assistance Equipment used: 1 person hand held assist Transfers: Sit to/from Stand Sit to Stand: Min guard General transfer comment: min guard to steady pt during transitions.  Ambulation/Gait Ambulation/Gait assistance: Min assist Ambulation Distance (Feet): 100 Feet Assistive device: 1 person hand held assist Gait Pattern/deviations: Step-through pattern Gait velocity: decreased Gait velocity interpretation: Below normal speed for age/gender General Gait Details: slow gait pattern due to increased DOE and general left sided pain with mobility.  Pt took two standing rest breaks <1  min each.  O2 sats remained stable (mid 90s) on RA during gait despite DOE 3/4.  Pt reaching for external support with both hands during gait.      Exercises Other Exercises Other Exercises: IS use x 4 reps max inspired volume 1000 mL.  Pt needed cues for correct technique Other Exercises: Encouraged pt to walk with therapy and staff TID while here in the hospital.  PT to come back tomorrow to assess gait with a cane.     PT Diagnosis: Difficulty walking;Abnormality of gait;Acute pain  PT Problem List: Decreased activity tolerance;Decreased strength;Decreased mobility;Decreased knowledge of use of DME;Pain;Cardiopulmonary status limiting activity PT  Treatment Interventions: DME instruction;Gait training;Functional mobility training;Therapeutic activities;Therapeutic exercise;Balance training;Neuromuscular re-education;Stair training;Patient/family education;Modalities     PT Goals(Current goals can be found in the care plan section) Acute Rehab PT Goals Patient Stated Goal: to go home, decrease pain PT Goal  Formulation: With patient Time For Goal Achievement: 03/08/14 Potential to Achieve Goals: Good  Visit Information  Last PT Received On: 02/22/14 Assistance Needed: +1 History of Present Illness: 78 y.o. male admitted to [facility] on 02/19/14 with syncopal episode.  Found to have 9-12 rib fractures with pneumothorax and (+) flu.        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  Drives, does all of his own self care.  Communication Communication: No difficulties    Cognition  Cognition Arousal/Alertness: Awake/alert Behavior During Therapy: WFL for tasks assessed/performed Overall Cognitive Status: Within Functional Limits for tasks assessed    Extremity/Trunk Assessment Upper Extremity Assessment Upper Extremity Assessment: LUE deficits/detail LUE Deficits / Details: limited by painful pulling into the left ribs.  LUE: Unable to fully assess due to pain Lower Extremity Assessment Lower Extremity Assessment: Overall WFL for tasks assessed Cervical / Trunk Assessment Cervical / Trunk Assessment: Normal   Balance Balance Overall balance assessment: Needs assistance Sitting-balance support: No upper extremity supported;Feet supported Sitting balance-Leahy Scale: Good Standing balance support: Bilateral upper extremity supported Standing balance-Leahy Scale: Poor  End of Session PT - End of Session Activity Tolerance: Patient limited by pain;Treatment limited secondary to medical complications (Comment) (limited by  increased DOE with gait. ) Patient left: in chair;with call bell/phone within reach Nurse Communication: Mobility status    Wells Guiles B. Judson, [place], DPT [phone]   02/22/2014, 11:09 AM

## 2014-02-22 NOTE — Progress Notes (Signed)
ANTIBIOTIC CONSULT NOTE - FOLLOW UP  Pharmacy Consult for levaquin Indication: Bronchitis/Sinusitis   No Known Allergies  Patient Measurements: Height: 5\' 11"  (180.3 cm) Weight: 208 lb 15.5 oz (94.788 kg) IBW/kg (Calculated) : 75.3  Vital Signs: Temp: 97 F (36.1 C) (03/22 0810) Temp src: Oral (03/22 0810) BP: 136/54 mmHg (03/22 0810) Pulse Rate: 70 (03/22 0400) Intake/Output from previous day: 03/21 0701 - 03/22 0700 In: 810 [P.O.:360; IV Piggyback:450] Out: 1500 [Urine:1500] Intake/Output from this shift:    Labs:  Recent Labs  02/20/14 0559 02/21/14 0336 02/22/14 0337  WBC 8.6 5.2 3.3*  HGB 9.2* 8.3* 8.8*  PLT 73* 62* 72*  CREATININE 0.76 0.71 0.62   Estimated Creatinine Clearance: 92.3 ml/min (by C-G formula based on Cr of 0.62). No results found for this basename: VANCOTROUGH, VANCOPEAK, VANCORANDOM, GENTTROUGH, GENTPEAK, GENTRANDOM, TOBRATROUGH, TOBRAPEAK, TOBRARND, AMIKACINPEAK, AMIKACINTROU, AMIKACIN,  in the last 72 hours   Microbiology: Recent Results (from the past 720 hour(s))  CULTURE, BLOOD (ROUTINE X 2)     Status: None   Collection Time    02/19/14 10:25 PM      Result Value Ref Range Status   Specimen Description BLOOD RIGHT ARM   Final   Special Requests     Final   Value: BOTTLES DRAWN AEROBIC AND ANAEROBIC 10CC BLUE,6CC RED   Culture  Setup Time     Final   Value: 02/20/2014 08:46     Performed at [facility]   Culture     Final   Value:        BLOOD CULTURE RECEIVED NO GROWTH TO DATE CULTURE WILL BE HELD FOR 5 DAYS BEFORE ISSUING A FINAL NEGATIVE REPORT     Performed at [facility]   Report Status PENDING   Incomplete  CULTURE, BLOOD (ROUTINE X 2)     Status: None   Collection Time    02/19/14 10:38 PM      Result Value Ref Range Status   Specimen Description BLOOD RIGHT HAND   Final   Special Requests BOTTLES DRAWN AEROBIC AND ANAEROBIC 10CC EACH   Final   Culture  Setup Time     Final   Value: 02/20/2014 08:46     Performed at [facility]   Culture     Final   Value:        BLOOD CULTURE RECEIVED NO GROWTH TO DATE CULTURE WILL BE HELD FOR 5 DAYS BEFORE ISSUING A FINAL NEGATIVE REPORT     Performed at [facility]   Report Status PENDING   Incomplete  URINE CULTURE     Status: None   Collection Time    02/20/14  2:01 AM      Result Value Ref Range Status   Specimen Description URINE, RANDOM   Final   Special Requests NONE   Final   Culture  Setup Time     Final   Value: 02/20/2014 09:04     Performed at [facility]     Final   Value: NO GROWTH     Performed at [facility]   Culture     Final   Value: NO GROWTH     Performed at [facility]   Report Status 02/21/2014 FINAL   Final    Anti-infectives   Start     Dose/Rate Route Frequency Ordered Stop   02/20/14 1000  oseltamivir (TAMIFLU) capsule 75 mg  75 mg Oral 2 times daily 02/20/14 0959 02/25/14 0959   02/19/14 2300  levofloxacin (LEVAQUIN) IVPB 750 mg     750 mg 100 mL/hr over 90 Minutes Intravenous Every 24 hours 02/19/14 2239        Assessment: 78 yo male with URI and influenza on levaquin and tamiflu.  WBC= 3.3, SCr= 0.62 abd CrCl ~ 90.   3/19 LQ>> 3/20 tamiflu>> 3/25  3/20 urine- neg 3/19 blood x2- ngtd   Plan:  -No levaquin dose changes needed -Will change levaquin to po as patient is tolerating po meds and afebrile -Consider defining length of therapy  Hildred Laser, Pharm D 02/22/2014 9:11 AM

## 2014-02-22 NOTE — Progress Notes (Signed)
Patient ID: Nicolas [name]  male  IEP:[id number]    DOB: 1935-12-15    DOA: 02/19/2014  PCP: No PCP Per Patient  Assessment/Plan:  Syncope: Likely orthostatic, versus cardiac versus due to acute illness with influenza and respiratory symptoms -  Troponin times 3 negative. 2-D echo; EF 55%.  Fever with URI and influenza, acute bronchitis, COPD: Positive for influenza B. - Continue incentive spirometry, Levaquin day 4. , Tamiflu day 3.   Multiple rib fractures with left-sided pleuritic chest pain, small left pneumothorax  - Fractures from left posterior ninth through 12th ribs - Appreciate trauma team for seeing the patient, will continue pain control - Repeated the chest x-ray by trauma team, shows no diagnostic pneumothorax - incentive spirometry -PT consulted.   Macrocytic anemia with anemia and thrombocytopenia - Reviewed the CBC with differential and smear with the pathologist- patient has marked monocytosis with atypical and immature forms, myeloids, with left shift, macrocytic anemia and thrombocytopenia  - B12 of 383, folate normal, ordered homocystine, methylmalonic acid, haptoglobin, LDH, intrinsic factor - Appreciate  Dr Juliann Mule evaluation. Patient to follow up with primary hematologist at discharge.  -WBC decreased, repeat in am with differential.   Left hip pain: x ray negative for fracture.   - FOBT negative  BPH  - Continue Flomax   DVT Prophylaxis:SCDs   Code Status:Full code   Family Communication: care discussed with patient.   Disposition:PT, OT consult. Home likely 3-23 if WBC stable.   Consultants:  Trauma, Dr. Hulen Skains    hematology, Dr. Juliann Mule  Procedures:  None   Antibiotics:  Levaquin 3/19>>  Tamiflu 3/20>    Subjective: Complaining of ribs pain. Coughing some.   Objective: Weight change:   Intake/Output Summary (Last 24 hours) at 02/22/14 1222 Last data filed at 02/22/14 1100  Gross per 24 hour  Intake    570 ml  Output   1300 ml    Net   -730 ml   Blood pressure 136/54, pulse 91, temperature 97 F (36.1 C), temperature source Oral, resp. rate 16, height 5\' 11"  (1.803 m), weight 94.788 kg (208 lb 15.5 oz), SpO2 96.00%.  Physical Exam: General: Alert and awake, oriented x3, not in any acute distress. CVS: S1-S2 clear, no murmur rubs or gallops Chest: clear to auscultation bilaterally.  Abdomen: soft nontender, nondistended, normal bowel sounds  Extremities: no cyanosis, clubbing or edema noted bilaterally   Lab Results: Basic Metabolic Panel:  Recent Labs Lab 02/21/14 0336 02/22/14 0337  NA 139 138  K 3.8 4.1  CL 104 103  CO2 25 25  GLUCOSE 89 103*  BUN 8 8  CREATININE 0.71 0.62  CALCIUM 7.9* 8.3*   Liver Function Tests:  Recent Labs Lab 02/20/14 0559  AST 23  ALT 11  ALKPHOS 60  BILITOT 0.8  PROT 6.8  ALBUMIN 3.0*   No results found for this basename: LIPASE, AMYLASE,  in the last 168 hours No results found for this basename: AMMONIA,  in the last 168 hours CBC:  Recent Labs Lab 02/20/14 0559 02/21/14 0336 02/22/14 0337  WBC 8.6 5.2 3.3*  NEUTROABS 2.8  --   --   HGB 9.2* 8.3* 8.8*  HCT 28.2* 26.1* 27.3*  MCV 111.9* 114.0* 112.3*  PLT 73* 62* 72*   Cardiac Enzymes:  Recent Labs Lab 02/19/14 2225 02/20/14 1313 02/20/14 1929  TROPONINI <0.30 <0.30 <0.30   BNP: No components found with this basename: POCBNP,  CBG: No results found for this basename: GLUCAP,  in the last 168 hours   Micro Results: Recent Results (from the past 240 hour(s))  CULTURE, BLOOD (ROUTINE X 2)     Status: None   Collection Time    02/19/14 10:25 PM      Result Value Ref Range Status   Specimen Description BLOOD RIGHT ARM   Final   Special Requests     Final   Value: BOTTLES DRAWN AEROBIC AND ANAEROBIC 10CC BLUE,6CC RED   Culture  Setup Time     Final   Value: 02/20/2014 08:46     Performed at [facility]   Culture     Final   Value:        BLOOD CULTURE RECEIVED NO GROWTH TO  DATE CULTURE WILL BE HELD FOR 5 DAYS BEFORE ISSUING A FINAL NEGATIVE REPORT     Performed at [facility]   Report Status PENDING   Incomplete  CULTURE, BLOOD (ROUTINE X 2)     Status: None   Collection Time    02/19/14 10:38 PM      Result Value Ref Range Status   Specimen Description BLOOD RIGHT HAND   Final   Special Requests BOTTLES DRAWN AEROBIC AND ANAEROBIC 10CC EACH   Final   Culture  Setup Time     Final   Value: 02/20/2014 08:46     Performed at [facility]   Culture     Final   Value:        BLOOD CULTURE RECEIVED NO GROWTH TO DATE CULTURE WILL BE HELD FOR 5 DAYS BEFORE ISSUING A FINAL NEGATIVE REPORT     Performed at [facility]   Report Status PENDING   Incomplete  URINE CULTURE     Status: None   Collection Time    02/20/14  2:01 AM      Result Value Ref Range Status   Specimen Description URINE, RANDOM   Final   Special Requests NONE   Final   Culture  Setup Time     Final   Value: 02/20/2014 09:04     Performed at [place]     Final   Value: NO GROWTH     Performed at [facility]   Culture     Final   Value: NO GROWTH     Performed at [facility]   Report Status 02/21/2014 FINAL   Final    Studies/Results: Dg Chest 2 View  02/19/2014   CLINICAL DATA:  Syncopal episode.  Fell.  EXAM: CHEST  2 VIEW  COMPARISON:  None.  FINDINGS: The cardiac silhouette, mediastinal and hilar contours are within normal limits. The lungs are clear. No pneumothorax. The bony thorax is intact.  IMPRESSION: No acute cardiopulmonary findings and intact bony thorax.   Electronically Signed   By: Kalman Jewels M.D.   On: 02/19/2014 19:04   Ct Head Wo Contrast  02/20/2014   CLINICAL DATA:  Syncope  EXAM: CT HEAD WITHOUT CONTRAST  TECHNIQUE: Contiguous axial images were obtained from the base of the skull through the vertex without intravenous contrast.  COMPARISON:  None.  FINDINGS: Mild atrophy with chronic  microvascular ischemic changes are present. Vascular calcifications present within the carotid siphons bilaterally.  There is no acute intracranial hemorrhage or infarct. No mass lesion or midline shift. Gray-white matter differentiation is well maintained. Ventricles are normal in size without evidence of hydrocephalus. No extra-axial fluid collection.  The calvarium  is intact.  Orbital soft tissues are within normal limits.  The paranasal sinuses and mastoid air cells are well pneumatized and free of fluid.  Scalp soft tissues are unremarkable.  IMPRESSION: 1. No acute intracranial process. 2. Mild atrophy and chronic microvascular ischemic changes.   Electronically Signed   By: Jeannine Boga M.D.   On: 02/20/2014 06:11   Ct Angio Chest Pe W/cm &/or Wo Cm  02/20/2014   CLINICAL DATA:  Chest hematoma  EXAM: CT ANGIOGRAPHY CHEST WITH CONTRAST  TECHNIQUE: Multidetector CT imaging of the chest was performed using the standard protocol during bolus administration of intravenous contrast. Multiplanar CT image reconstructions and MIPs were obtained to evaluate the vascular anatomy.  CONTRAST:  158mL OMNIPAQUE IOHEXOL 350 MG/ML SOLN  COMPARISON:  Prior radiograph and CT from 02/19/2014.  FINDINGS: No pathologically enlarged mediastinal, hilar, or axillary lymph nodes are identified.  Intrathoracic aorta is of normal caliber. Scattered atherosclerotic calcifications seen within the aortic arch and the origin of the great vessels, most prevalent at the origin of the left subclavian artery. Heart size is within normal limits. No pericardial effusion.  Pulmonary arterial tree is well opacified. No filling defects seen to suggest acute pulmonary embolism. Re-formatted imaging confirms these findings.  Moderate left with mild right pleural effusion is present there is associated bibasilar atelectasis. Opacities within the inferior right upper and lower right lower lobes No pulmonary edema. There is a small left-sided  pneumothorax seen anteriorly (series 7, image 75).  Mild centrilobular emphysema is seen within the upper lobes bilaterally.  Hepatic cysts again noted, unchanged. Contour irregularity along the liver capsule overlying the posterior right hepatic lobe again noted, unchanged relative to prior CT. Remainder the partially visualized upper abdomen is unremarkable.  Acute fractures of the left posterior ninth through twelfth ribs again seen. There is adjacent soft tissue gas within the posterior chest wall way associated hematoma, grossly stable. No right-sided rib fractures identified.  Review of the MIP images confirms the above findings.  IMPRESSION: 1. No CT evidence of acute pulmonary embolism. 2. Acute fractures of the left posterior ninth through twelfth ribs with associated soft tissue gas and hematoma within the posterior left chest wall, not significantly changed relative to previous study. 3. Small left-sided pneumothorax as above. 4. Moderate left with smaller right pleural effusion with associated bibasilar atelectasis. 5. Patchy and nodular opacities within the inferior right upper and right lower lobes. While these findings may in part reflect atelectasis, possible endobronchial infection/aspiration could also be considered. 6. Upper lobe predominant centrilobular emphysema. Critical Value/emergent results were called by telephone at the time of interpretation on 02/20/2014 at 6:03 AM to Dr. Gean Birchwood , who verbally acknowledged these results.   Electronically Signed   By: Jeannine Boga M.D.   On: 02/20/2014 06:05   Ct Abdomen Pelvis W Contrast  02/19/2014   CLINICAL DATA:  No surrounding hematoma or hemo peritoneum. There are small scattered hepatic cysts. No biliary dilatation. The gallbladder is normal. No common bowel duct dilatation. The pancreas is normal. The spleen is normal. No definite splenic laceration. The adrenal glands and kidneys are unremarkable. No renal lacerations.   EXAM: CT ABDOMEN AND PELVIS WITH CONTRAST  TECHNIQUE: Multidetector CT imaging of the abdomen and pelvis was performed using the standard protocol following bolus administration of intravenous contrast.  CONTRAST:  152mL OMNIPAQUE IOHEXOL 300 MG/ML  SOLN  COMPARISON:  None.  FINDINGS: The lung bases demonstrate bilateral pleural effusions and patchy atelectasis. No definite  pneumothorax. Right lower lobe airspace process with peribronchial thickening. Could not exclude aspiration or pulmonary contusion. The heart is normal in size. No pericardial effusion.  There are multiple left posterior rib fractures. This involves the ninth, tenth, eleventh and twelfth ribs. There is air in the soft tissues along with hematoma and soft tissue swelling. This could be due to an open laceration/penetrating wound or a pneumothorax that has sealed off.  There is an abnormality involving the posterior aspect of the right hepatic lobe. This is most likely 18 congenital defect or fold in the liver air. It falls along with the diaphragm and is a small amount of fat interposed. I do not think this is a liver laceration in is no subcapsular hematoma or intraperitoneal blood.  The stomach, duodenum, small bowel and colon are grossly normal. No mesenteric or retroperitoneal mass, adenopathy or hematoma. She aorta and branch vessels are normal. The major venous structures are patent.  The prostate gland is enlarged. The bladder appears normal. The seminal vesicles are normal. No pelvic mass, adenopathy or hematoma.  The bony pelvis is intact. Moderate SI joint degenerative changes. No lumbar spine compression fractures or spinous process fractures.  IMPRESSION: 1. Left lower posterior rib fractures with associated chest wall hematoma. Air in the soft tissues may be due to a laceration, penetrating wound or pneumothorax that has sealed off. There is an associated left pleural effusion. 2. No acute intra-abdominal/pelvic injury. 3. 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 4. Prostate gland enlargement.   Electronically Signed   By: Kalman Jewels M.D.   On: 02/19/2014 20:25   Dg Chest Port 1 View  02/20/2014   CLINICAL DATA:  Rib fractures, fever  EXAM: PORTABLE CHEST - 1 VIEW  COMPARISON:  02/20/2014 with  FINDINGS: Cardiomediastinal silhouette is unremarkable. Small left pleural effusion with left basilar atelectasis or infiltrate. Left posterior lower rib fractures are not identified by x-ray. No diagnostic pneumothorax.  IMPRESSION: Small left pleural effusion with left basilar atelectasis or infiltrate. No diagnostic pneumothorax.   Electronically Signed   By: Lahoma Crocker M.D.   On: 02/20/2014 10:10    Medications: Scheduled Meds: . finasteride  5 mg Oral Daily  . levofloxacin  750 mg Oral Daily  . oseltamivir  75 mg Oral BID  . polyethylene glycol  17 g Oral Daily  . sodium chloride  3 mL Intravenous Q12H  . tamsulosin  0.4 mg Oral Daily      LOS: 3 days   Niel Hummer A M.D. [facility] 02/22/2014, 12:22 PM Pager: [phone]  If 7PM-7AM, please contact night-coverage [email or web address] Password [email or web address]

## 2014-02-23 LAB — CBC WITH DIFFERENTIAL/PLATELET
Basophils Absolute: 0 10*3/uL (ref 0.0–0.1)
Basophils Relative: 0 % (ref 0–1)
Eosinophils Absolute: 0 10*3/uL (ref 0.0–0.7)
Eosinophils Relative: 0 % (ref 0–5)
HCT: 27.1 % — ABNORMAL LOW (ref 39.0–52.0)
Hemoglobin: 8.8 g/dL — ABNORMAL LOW (ref 13.0–17.0)
Lymphocytes Relative: 24 % (ref 12–46)
Lymphs Abs: 1.1 10*3/uL (ref 0.7–4.0)
MCH: 36.5 pg — ABNORMAL HIGH (ref 26.0–34.0)
MCHC: 32.5 g/dL (ref 30.0–36.0)
MCV: 112.4 fL — ABNORMAL HIGH (ref 78.0–100.0)
MONOS PCT: 38 % — AB (ref 3–12)
Monocytes Absolute: 1.7 10*3/uL — ABNORMAL HIGH (ref 0.1–1.0)
Neutro Abs: 1.7 10*3/uL (ref 1.7–7.7)
Neutrophils Relative %: 38 % — ABNORMAL LOW (ref 43–77)
PLATELETS: 88 10*3/uL — AB (ref 150–400)
RBC: 2.41 MIL/uL — ABNORMAL LOW (ref 4.22–5.81)
RDW: 15.2 % (ref 11.5–15.5)
WBC: 4.5 10*3/uL (ref 4.0–10.5)

## 2014-02-23 LAB — BASIC METABOLIC PANEL
BUN: 8 mg/dL (ref 6–23)
CO2: 27 mEq/L (ref 19–32)
CREATININE: 0.65 mg/dL (ref 0.50–1.35)
Calcium: 8.4 mg/dL (ref 8.4–10.5)
Chloride: 103 mEq/L (ref 96–112)
GFR calc non Af Amer: >90 mL/min (ref >90)
Glucose, Bld: 103 mg/dL — ABNORMAL HIGH (ref 70–99)
Potassium: 3.7 mEq/L (ref 3.7–5.3)
Sodium: 139 mEq/L (ref 137–147)

## 2014-02-23 MED ORDER — POLYETHYLENE GLYCOL 3350 17 G PO PACK: 17.0000 g | PACK | Freq: Every day | ORAL | Status: DC

## 2014-02-23 MED ORDER — OXYCODONE HCL 5 MG PO TABS: 5.0000 mg | ORAL_TABLET | Freq: Four times a day (QID) | ORAL | Status: DC | PRN

## 2014-02-23 MED ORDER — OSELTAMIVIR PHOSPHATE 75 MG PO CAPS: 75.0000 mg | ORAL_CAPSULE | Freq: Two times a day (BID) | ORAL | Status: DC

## 2014-02-23 MED ORDER — LEVOFLOXACIN 750 MG PO TABS: 750.0000 mg | ORAL_TABLET | Freq: Every day | ORAL | Status: DC

## 2014-02-23 NOTE — Progress Notes (Signed)
Discharge instructions and prescriptions given.  Left via wheelchair with family member Lorrene Reid

## 2014-02-23 NOTE — Discharge Summary (Signed)
Physician Discharge Summary  Nicolas [name] VZD:[id number] DOB: November 30, 1936 DOA: 02/19/2014  PCP: No PCP Per Patient  Admit date: 02/19/2014 Discharge date: 02/23/2014  Time spent: 35 minutes  Recommendations for Outpatient Follow-up:  1. Needs to follow up with Primary Hematologist for further care of thrombocytopenia.   Discharge Diagnoses:    Syncope   Acute fractures of the left posterior ninth through twelfth ribs with associated soft tissue gas and       hematoma within the posterior left chest wall,   Macrocytic anemia   Influenza with other respiratory manifestations   Pneumothorax, traumatic   Fracture of multiple ribs   Anemia   Discharge Condition: Stable.   Diet recommendation: Heart Healthy  Filed Weights   02/19/14 1812 02/19/14 2124 02/21/14 0438  Weight: 94.348 kg (208 lb) 94.938 kg (209 lb 4.8 oz) 94.788 kg (208 lb 15.5 oz)    History of present illness:  Nicolas [name] is a 78 y.o. male with history of benign prosthetic hypertrophy had a brief episode of loss of consciousness at the [employer] where he works. Patient states that was attending a funeral and suddenly passed out without any premonition. Patient denies any chest pain shortness of breath palpitations focal deficits headache prior or after the fall. Patient did not have any tongue bite or incontinence of urine. After he fell he hit his left mid back. States he also hit his head but has not had any laceration. In the ER patient's EKG showed normal sinus rhythm. Patient was found to be mildly febrile. CBC shows anemia but stool for occult blood was negative. Since patient is complaining of left mid back pain patient had a CT abdomen pelvis which shows left lower rib fracture with air trapping and hematoma. Patient has been admitted for further observation. Patient states he has not had any syncopal episodes prior. Over the last couple of weeks patient has been having productive cough and 2 weeks ago he was treated for UTI  and prostate symptoms with one week of antibiotics and finasteride was added. Patient's family states that he has been recently stressed after his nephew died.   Hospital Course:  Syncope: Likely orthostatic, versus cardiac versus due to acute illness with influenza and respiratory symptoms  - Troponin times 3 negative. 2-D echo; EF 55%.  -Follow up with PCP, might need cardiologist referral.   Fever with URI and influenza, acute bronchitis, COPD: Positive for influenza B.  - Continue incentive spirometry, Levaquin day 5. , Tamiflu day 4. Will provide prescription to complete 5 days of tamiflu and 7 days of levaquin.   Multiple rib fractures with left-sided pleuritic chest pain, small left pneumothorax  - Fractures from left posterior ninth through 12th ribs  - Appreciate trauma team for seeing the patient, will continue pain control  - Repeated the chest x-ray by trauma team, shows no diagnostic pneumothorax  - incentive spirometry  -PT consulted.   Macrocytic anemia with anemia and thrombocytopenia  - Reviewed the CBC with differential and smear with the pathologist- patient has marked monocytosis with atypical and immature forms, myeloids, with left shift, macrocytic anemia and thrombocytopenia  - B12 of 383, folate normal, ordered homocystine, methylmalonic acid, haptoglobin, LDH, intrinsic factor  - Appreciate Dr Juliann Mule evaluation. Patient to follow up with primary hematologist at discharge.  -CBC stable.   Left hip pain: x ray negative for fracture.   BPH  - Continue Flomax    Procedures:  none  Consultations:  Trauma team  Hematologist  Discharge Exam: Filed Vitals:   02/23/14 0400  BP: 141/73  Pulse: 62  Temp: 98.6 F (37 C)  Resp: 16    General: No distress,  Cardiovascular: S 1, S 2 RRR Respiratory: CTA  Discharge Instructions  Discharge Orders   Future Orders Complete By Expires   Diet - low sodium heart healthy  As directed    Increase activity  slowly  As directed        Medication List         acetaminophen 500 MG tablet  Commonly known as:  TYLENOL  Take 1,000 mg by mouth every 6 (six) hours as needed for mild pain.     finasteride 5 MG tablet  Commonly known as:  PROSCAR  Take 5 mg by mouth daily.     levofloxacin 750 MG tablet  Commonly known as:  LEVAQUIN  Take 1 tablet (750 mg total) by mouth daily.     oseltamivir 75 MG capsule  Commonly known as:  TAMIFLU  Take 1 capsule (75 mg total) by mouth 2 (two) times daily.     oxyCODONE 5 MG immediate release tablet  Commonly known as:  Oxy IR/ROXICODONE  Take 1 tablet (5 mg total) by mouth every 6 (six) hours as needed for moderate pain.     polyethylene glycol packet  Commonly known as:  MIRALAX / GLYCOLAX  Take 17 g by mouth daily.     tamsulosin 0.4 MG Caps capsule  Commonly known as:  FLOMAX  Take 0.4 mg by mouth daily.       No Known Allergies    The results of significant diagnostics from this hospitalization (including imaging, microbiology, ancillary and laboratory) are listed below for reference.    Significant Diagnostic Studies: Dg Chest 2 View  02/19/2014   CLINICAL DATA:  Syncopal episode.  Fell.  EXAM: CHEST  2 VIEW  COMPARISON:  None.  FINDINGS: The cardiac silhouette, mediastinal and hilar contours are within normal limits. The lungs are clear. No pneumothorax. The bony thorax is intact.  IMPRESSION: No acute cardiopulmonary findings and intact bony thorax.   Electronically Signed   By: Kalman Jewels M.D.   On: 02/19/2014 19:04   Dg Hip Complete Left  02/21/2014   CLINICAL DATA:  Fall onto left side  EXAM: LEFT HIP - COMPLETE 2+ VIEW  COMPARISON:  None  FINDINGS: Symmetric hip and SI joints.  Pelvis grossly intact.  No definite fracture, dislocation, or bone destruction.  IMPRESSION: No acute osseous abnormalities.   Electronically Signed   By: Lavonia Dana M.D.   On: 02/21/2014 18:44   Ct Head Wo Contrast  02/20/2014   CLINICAL DATA:   Syncope  EXAM: CT HEAD WITHOUT CONTRAST  TECHNIQUE: Contiguous axial images were obtained from the base of the skull through the vertex without intravenous contrast.  COMPARISON:  None.  FINDINGS: Mild atrophy with chronic microvascular ischemic changes are present. Vascular calcifications present within the carotid siphons bilaterally.  There is no acute intracranial hemorrhage or infarct. No mass lesion or midline shift. Gray-white matter differentiation is well maintained. Ventricles are normal in size without evidence of hydrocephalus. No extra-axial fluid collection.  The calvarium is intact.  Orbital soft tissues are within normal limits.  The paranasal sinuses and mastoid air cells are well pneumatized and free of fluid.  Scalp soft tissues are unremarkable.  IMPRESSION: 1. No acute intracranial process. 2. Mild atrophy and chronic microvascular ischemic changes.   Electronically Signed   By:  Jeannine Boga M.D.   On: 02/20/2014 06:11   Ct Angio Chest Pe W/cm &/or Wo Cm  02/20/2014   CLINICAL DATA:  Chest hematoma  EXAM: CT ANGIOGRAPHY CHEST WITH CONTRAST  TECHNIQUE: Multidetector CT imaging of the chest was performed using the standard protocol during bolus administration of intravenous contrast. Multiplanar CT image reconstructions and MIPs were obtained to evaluate the vascular anatomy.  CONTRAST:  157mL OMNIPAQUE IOHEXOL 350 MG/ML SOLN  COMPARISON:  Prior radiograph and CT from 02/19/2014.  FINDINGS: No pathologically enlarged mediastinal, hilar, or axillary lymph nodes are identified.  Intrathoracic aorta is of normal caliber. Scattered atherosclerotic calcifications seen within the aortic arch and the origin of the great vessels, most prevalent at the origin of the left subclavian artery. Heart size is within normal limits. No pericardial effusion.  Pulmonary arterial tree is well opacified. No filling defects seen to suggest acute pulmonary embolism. Re-formatted imaging confirms these  findings.  Moderate left with mild right pleural effusion is present there is associated bibasilar atelectasis. Opacities within the inferior right upper and lower right lower lobes No pulmonary edema. There is a small left-sided pneumothorax seen anteriorly (series 7, image 75).  Mild centrilobular emphysema is seen within the upper lobes bilaterally.  Hepatic cysts again noted, unchanged. Contour irregularity along the liver capsule overlying the posterior right hepatic lobe again noted, unchanged relative to prior CT. Remainder the partially visualized upper abdomen is unremarkable.  Acute fractures of the left posterior ninth through twelfth ribs again seen. There is adjacent soft tissue gas within the posterior chest wall way associated hematoma, grossly stable. No right-sided rib fractures identified.  Review of the MIP images confirms the above findings.  IMPRESSION: 1. No CT evidence of acute pulmonary embolism. 2. Acute fractures of the left posterior ninth through twelfth ribs with associated soft tissue gas and hematoma within the posterior left chest wall, not significantly changed relative to previous study. 3. Small left-sided pneumothorax as above. 4. Moderate left with smaller right pleural effusion with associated bibasilar atelectasis. 5. Patchy and nodular opacities within the inferior right upper and right lower lobes. While these findings may in part reflect atelectasis, possible endobronchial infection/aspiration could also be considered. 6. Upper lobe predominant centrilobular emphysema. Critical Value/emergent results were called by telephone at the time of interpretation on 02/20/2014 at 6:03 AM to Dr. Gean Birchwood , who verbally acknowledged these results.   Electronically Signed   By: Jeannine Boga M.D.   On: 02/20/2014 06:05   Ct Abdomen Pelvis W Contrast  02/19/2014   CLINICAL DATA:  No surrounding hematoma or hemo peritoneum. There are small scattered hepatic cysts. No  biliary dilatation. The gallbladder is normal. No common bowel duct dilatation. The pancreas is normal. The spleen is normal. No definite splenic laceration. The adrenal glands and kidneys are unremarkable. No renal lacerations.  EXAM: CT ABDOMEN AND PELVIS WITH CONTRAST  TECHNIQUE: Multidetector CT imaging of the abdomen and pelvis was performed using the standard protocol following bolus administration of intravenous contrast.  CONTRAST:  155mL OMNIPAQUE IOHEXOL 300 MG/ML  SOLN  COMPARISON:  None.  FINDINGS: The lung bases demonstrate bilateral pleural effusions and patchy atelectasis. No definite pneumothorax. Right lower lobe airspace process with peribronchial thickening. Could not exclude aspiration or pulmonary contusion. The heart is normal in size. No pericardial effusion.  There are multiple left posterior rib fractures. This involves the ninth, tenth, eleventh and twelfth ribs. There is air in the soft tissues along with hematoma and  soft tissue swelling. This could be due to an open laceration/penetrating wound or a pneumothorax that has sealed off.  There is an abnormality involving the posterior aspect of the right hepatic lobe. This is most likely 18 congenital defect or fold in the liver air. It falls along with the diaphragm and is a small amount of fat interposed. I do not think this is a liver laceration in is no subcapsular hematoma or intraperitoneal blood.  The stomach, duodenum, small bowel and colon are grossly normal. No mesenteric or retroperitoneal mass, adenopathy or hematoma. She aorta and branch vessels are normal. The major venous structures are patent.  The prostate gland is enlarged. The bladder appears normal. The seminal vesicles are normal. No pelvic mass, adenopathy or hematoma.  The bony pelvis is intact. Moderate SI joint degenerative changes. No lumbar spine compression fractures or spinous process fractures.  IMPRESSION: 1. Left lower posterior rib fractures with associated  chest wall hematoma. Air in the soft tissues may be due to a laceration, penetrating wound or pneumothorax that has sealed off. There is an associated left pleural effusion. 2. No acute intra-abdominal/pelvic injury. 3. 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 4. Prostate gland enlargement.   Electronically Signed   By: Kalman Jewels M.D.   On: 02/19/2014 20:25   Dg Chest Port 1 View  02/20/2014   CLINICAL DATA:  Rib fractures, fever  EXAM: PORTABLE CHEST - 1 VIEW  COMPARISON:  02/20/2014 with  FINDINGS: Cardiomediastinal silhouette is unremarkable. Small left pleural effusion with left basilar atelectasis or infiltrate. Left posterior lower rib fractures are not identified by x-ray. No diagnostic pneumothorax.  IMPRESSION: Small left pleural effusion with left basilar atelectasis or infiltrate. No diagnostic pneumothorax.   Electronically Signed   By: Lahoma Crocker M.D.   On: 02/20/2014 10:10    Microbiology: Recent Results (from the past 240 hour(s))  CULTURE, BLOOD (ROUTINE X 2)     Status: None   Collection Time    02/19/14 10:25 PM      Result Value Ref Range Status   Specimen Description BLOOD RIGHT ARM   Final   Special Requests     Final   Value: BOTTLES DRAWN AEROBIC AND ANAEROBIC 10CC BLUE,6CC RED   Culture  Setup Time     Final   Value: 02/20/2014 08:46     Performed at [facility]   Culture     Final   Value:        BLOOD CULTURE RECEIVED NO GROWTH TO DATE CULTURE WILL BE HELD FOR 5 DAYS BEFORE ISSUING A FINAL NEGATIVE REPORT     Performed at [facility]   Report Status PENDING   Incomplete  CULTURE, BLOOD (ROUTINE X 2)     Status: None   Collection Time    02/19/14 10:38 PM      Result Value Ref Range Status   Specimen Description BLOOD RIGHT HAND   Final   Special Requests BOTTLES DRAWN AEROBIC AND ANAEROBIC 10CC EACH   Final   Culture  Setup Time     Final   Value: 02/20/2014 08:46      Performed at [facility]   Culture     Final   Value:        BLOOD CULTURE RECEIVED NO GROWTH TO DATE CULTURE WILL BE HELD FOR 5 DAYS BEFORE ISSUING A FINAL NEGATIVE REPORT  Performed at [facility]   Report Status PENDING   Incomplete  URINE CULTURE     Status: None   Collection Time    02/20/14  2:01 AM      Result Value Ref Range Status   Specimen Description URINE, RANDOM   Final   Special Requests NONE   Final   Culture  Setup Time     Final   Value: 02/20/2014 09:04     Performed at [facility]     Final   Value: NO GROWTH     Performed at [facility]   Culture     Final   Value: NO GROWTH     Performed at [facility]   Report Status 02/21/2014 FINAL   Final     Labs: Basic Metabolic Panel:  Recent Labs Lab 02/19/14 1820 02/20/14 0559 02/21/14 0336 02/22/14 0337 02/23/14 0642  NA 136* 134* 139 138 139  K 4.2 3.8 3.8 4.1 3.7  CL 99 99 104 103 103  CO2 22 22 25 25 27   GLUCOSE 148* 109* 89 103* 103*  BUN 16 12 8 8 8   CREATININE 0.95 0.76 0.71 0.62 0.65  CALCIUM 8.6 7.9* 7.9* 8.3* 8.4   Liver Function Tests:  Recent Labs Lab 02/20/14 0559  AST 23  ALT 11  ALKPHOS 60  BILITOT 0.8  PROT 6.8  ALBUMIN 3.0*   No results found for this basename: LIPASE, AMYLASE,  in the last 168 hours No results found for this basename: AMMONIA,  in the last 168 hours CBC:  Recent Labs Lab 02/19/14 1911 02/20/14 0559 02/21/14 0336 02/22/14 0337 02/23/14 0642  WBC 4.0 8.6 5.2 3.3* 4.5  NEUTROABS  --  2.8  --   --  1.7  HGB 9.7* 9.2* 8.3* 8.8* 8.8*  HCT 29.9* 28.2* 26.1* 27.3* 27.1*  MCV 112.8* 111.9* 114.0* 112.3* 112.4*  PLT 71* 73* 62* 72* 88*   Cardiac Enzymes:  Recent Labs Lab 02/19/14 2225 02/20/14 1313 02/20/14 1929  TROPONINI <0.30 <0.30 <0.30   BNP: BNP (last 3 results) No results found for this basename: PROBNP,  in the last 8760 hours CBG: No results found for this basename:  GLUCAP,  in the last 168 hours     Signed:  Niel Hummer A  [facility] 02/23/2014, 8:46 AM

## 2014-02-24 LAB — INTRINSIC FACTOR ANTIBODIES: Intrinsic Factor: NEGATIVE

## 2014-02-24 LAB — METHYLMALONIC ACID, SERUM: Methylmalonic Acid, Quantitative: 0.21 umol/L (ref <0.40)

## 2014-02-26 LAB — CULTURE, BLOOD (ROUTINE X 2)
Culture: NO GROWTH
Culture: NO GROWTH

## 2014-06-08 ENCOUNTER — Other Ambulatory Visit: Payer: Medicare Other

## 2014-12-28 ENCOUNTER — Encounter (INDEPENDENT_AMBULATORY_CARE_PROVIDER_SITE_OTHER): Payer: Self-pay

## 2014-12-28 ENCOUNTER — Ambulatory Visit (INDEPENDENT_AMBULATORY_CARE_PROVIDER_SITE_OTHER): Payer: Medicare Other | Admitting: Family

## 2014-12-28 ENCOUNTER — Encounter: Payer: Self-pay | Admitting: Family

## 2014-12-28 VITALS — BP 147/71 | HR 74 | Temp 97.3°F | Ht 71.0 in | Wt 206.2 lb

## 2014-12-28 DIAGNOSIS — K121 Other forms of stomatitis: Secondary | ICD-10-CM

## 2014-12-28 MED ORDER — MAGIC MOUTHWASH W/LIDOCAINE: 5.0000 mL | Freq: Four times a day (QID) | ORAL | Status: DC | PRN

## 2014-12-28 MED ORDER — VALACYCLOVIR HCL 1 G PO TABS: 2000.0000 mg | ORAL_TABLET | Freq: Two times a day (BID) | ORAL | Status: DC

## 2014-12-28 NOTE — Progress Notes (Signed)
   Subjective:    Patient ID: Nicolas [name], male    DOB: Sep 25, 1936, 79 y.o.   MRN: [id number]  Oral Pain  This is a new (Pt has a lesion on his tongue and his upper left gum) problem. The current episode started in the past 7 days. The problem occurs constantly. The problem has been waxing and waning. The pain is mild. Associated symptoms include facial pain. Pertinent negatives include no difficulty swallowing, fever or oral bleeding. He has tried nothing for the symptoms. The treatment provided no relief.      Review of Systems  Constitutional: Negative.  Negative for fever.  HENT: Negative.   Respiratory: Negative.   Cardiovascular: Negative.   Gastrointestinal: Negative.   Endocrine: Negative.   Genitourinary: Negative.   Musculoskeletal: Negative.   Neurological: Negative.   Hematological: Negative.   Psychiatric/Behavioral: Negative.   All other systems reviewed and are negative.      Objective:   Physical Exam  Constitutional: He is oriented to person, place, and time. He appears well-developed and well-nourished. No distress.  HENT:  Head: Normocephalic.  Right Ear: External ear normal.  Left Ear: External ear normal.  Mouth/Throat: Oral lesions (oral ulcer on left side of tongue with yellow center, oral lesion on top of upper gum- Pt unable to wear dentures r/t  pain) present.  Cardiovascular: Normal rate, regular rhythm, normal heart sounds and intact distal pulses.   No murmur heard. Pulmonary/Chest: Effort normal and breath sounds normal. No respiratory distress. He has no wheezes.  Abdominal: Soft. Bowel sounds are normal. He exhibits no distension. There is no tenderness.  Musculoskeletal: Normal range of motion. He exhibits no edema or tenderness.  Neurological: He is alert and oriented to person, place, and time.  Skin: Skin is warm and dry. No rash noted. No erythema.  Psychiatric: He has a normal mood and affect. His behavior is normal. Judgment and thought  content normal.  Vitals reviewed.    BP 147/71 mmHg  Pulse 74  Temp(Src) 97.3 F (36.3 C) (Oral)  Ht 5\' 11"  (1.803 m)  Wt 206 lb 3.2 oz (93.532 kg)  BMI 28.77 kg/m2     Assessment & Plan:  1. Oral ulcer Meds ordered this encounter  Medications  . valACYclovir (VALTREX) 1000 MG tablet    Sig: Take 2 tablets (2,000 mg total) by mouth 2 (two) times daily.    Dispense:  4 tablet    Refill:  0    Order Specific Question:  Supervising Provider    Answer:  Chipper Herb [[id number]]  . Alum & Mag Hydroxide-Simeth (MAGIC MOUTHWASH W/LIDOCAINE) SOLN    Sig: Take 5 mLs by mouth 4 (four) times daily as needed for mouth pain.    Dispense:  100 mL    Refill:  4    Order Specific Question:  Supervising Provider    Answer:  Chipper Herb [[id number]]   -Soft diet -Cold compresses  -RTO prn or if ulcer lasts longer than 2 weeks - Pt states he gets his blood work drawn at the [place] and his iron, B12, and Vit D levels are normal  Evelina Dun, FNP

## 2014-12-28 NOTE — Patient Instructions (Signed)
Oral Ulcers Oral ulcers are painful, shallow sores around the lining of the mouth. They can affect the gums, the inside of the lips, and the cheeks. (Sores on the outside of the lips and on the face are different.) They typically first occur in school-aged children and teenagers. Oral ulcers may also be called canker sores or cold sores. CAUSES  Canker sores and cold sores can be caused by many factors including:  Infection.  Injury.  Sun exposure.  Medications.  Emotional stress.  Food allergies.  Vitamin deficiencies.  Toothpastes containing sodium lauryl sulfate. The herpes virus can be the cause of mouth ulcers. The first infection can be severe and cause 10 or more ulcers on the gums, tongue, and lips with fever and difficulty in swallowing. This infection usually occurs between the ages of 1 and 3 years.  SYMPTOMS  The typical sore is about  inch (6 mm) in size and is an oval or round ulcer with red borders. DIAGNOSIS  Your caregiver can diagnose simple oral ulcers by examination. Additional testing is usually not required.  TREATMENT  Treatment is aimed at pain relief. Generally, oral ulcers resolve by themselves within 1 to 2 weeks without medication and are not contagious unless caused by herpes (and other viruses). Antibiotics are not effective with mouth sores. Avoid direct contact with others until the ulcer is completely healed. See your caregiver for follow-up care as recommended. Also:  Offer a soft diet.  Encourage plenty of fluids to prevent dehydration. Popsicles and milk shakes can be helpful.  Avoid acidic and salty foods and drinks such as orange juice.  Infants and young children will often refuse to drink because of pain. Using a teaspoon, cup, or syringe to give small amounts of fluids frequently can help prevent dehydration.  Cold compresses on the face may help reduce pain.  Pain medication can help control soreness.  A solution of diphenhydramine  mixed with a liquid antacid can be useful to decrease the soreness of ulcers. Consult a caregiver for the dosing.  Liquids or ointments with a numbing ingredient may be helpful when used as recommended.  Older children and teenagers can rinse their mouth with a salt-water mixture (1/2 teaspoon of salt in 8 ounces of water) four times a day. This treatment is uncomfortable but may reduce the time the ulcers are present.  There are many over-the-counter throat lozenges and medications available for oral ulcers. Their effectiveness has not been studied.  Consult your medical caregiver prior to using homeopathic treatments for oral ulcers. SEEK MEDICAL CARE IF:   You think your child needs to be seen.  The pain worsens and you cannot control it.  There are 4 or more ulcers.  The lips and gums begin to bleed and crust.  A single mouth ulcer is near a tooth that is causing a toothache or pain.  Your child has a fever, swollen face, or swollen glands.  The ulcers began after starting a medication.  Mouth ulcers keep reoccurring or last more than 2 weeks.  You think your child is not taking adequate fluids. SEEK IMMEDIATE MEDICAL CARE IF:   Your child has a high fever.  Your child is unable to swallow or becomes dehydrated.  Your child looks or acts very ill.  An ulcer caused by a chemical your child accidentally put in their mouth. Document Released: 12/28/2004 Document Revised: 04/06/2014 Document Reviewed: 08/12/2009 ExitCare Patient Information 2015 [employer]. This information is not intended to replace advice   given to you by your health care provider. Make sure you discuss any questions you have with your health care provider.  

## 2015-02-15 DIAGNOSIS — R972 Elevated prostate specific antigen [PSA]: Secondary | ICD-10-CM | POA: Diagnosis not present

## 2015-02-15 DIAGNOSIS — N138 Other obstructive and reflux uropathy: Secondary | ICD-10-CM | POA: Diagnosis not present

## 2015-02-15 DIAGNOSIS — N401 Enlarged prostate with lower urinary tract symptoms: Secondary | ICD-10-CM | POA: Diagnosis not present

## 2015-04-19 ENCOUNTER — Encounter: Payer: Self-pay | Admitting: Family

## 2015-04-19 ENCOUNTER — Ambulatory Visit (INDEPENDENT_AMBULATORY_CARE_PROVIDER_SITE_OTHER): Payer: Medicare Other | Admitting: Family

## 2015-04-19 VITALS — BP 136/64 | HR 82 | Temp 100.0°F | Ht 71.0 in | Wt 206.0 lb

## 2015-04-19 DIAGNOSIS — B009 Herpesviral infection, unspecified: Secondary | ICD-10-CM | POA: Diagnosis not present

## 2015-04-19 MED ORDER — VALACYCLOVIR HCL 1 G PO TABS: 2000.0000 mg | ORAL_TABLET | Freq: Two times a day (BID) | ORAL | Status: DC

## 2015-04-19 NOTE — Patient Instructions (Signed)
Cold Sore  A cold sore (fever blister) is a skin infection caused by the herpes simplex virus (HSV-1). HSV-1 is closely related to the virus that causes genital herpes (HSV-2), but they are not the same even though both viruses can cause oral and genital infections. Cold sores are small, fluid-filled sores inside of the mouth or on the lips, gums, nose, chin, cheeks, or fingers.   The herpes simplex virus can be easily passed (contagious) to other people through close personal contact, such as kissing or sharing personal items. The virus can also spread to other parts of the body, such as the eyes or genitals. Cold sores are contagious until the sores crust over completely. They often heal within 2 weeks.   Once a person is infected, the herpes simplex virus remains permanently in the body. Therefore, there is no cure for cold sores, and they often recur when a person is tired, stressed, sick, or gets too much sun. Additional factors that can cause a recurrence include hormone changes in menstruation or pregnancy, certain drugs, and cold weather.   CAUSES   Cold sores are caused by the herpes simplex virus. The virus is spread from person to person through close contact, such as through kissing, touching the affected area, or sharing personal items such as lip balm, razors, or eating utensils.   SYMPTOMS   The first infection may not cause symptoms. If symptoms develop, the symptoms often go through different stages. Here is how a cold sore develops:   · Tingling, itching, or burning is felt 1-2 days before the outbreak.    · Fluid-filled blisters appear on the lips, inside the mouth, nose, or on the cheeks.    · The blisters start to ooze clear fluid.    · The blisters dry up and a yellow crust appears in its place.    · The crust falls off.    Symptoms depend on whether it is the initial outbreak or a recurrence. Some other symptoms with the first outbreak may include:   · Fever.    · Sore throat.    · Headache.     · Muscle aches.    · Swollen neck glands.    DIAGNOSIS   A diagnosis is often made based on your symptoms and looking at the sores. Sometimes, a sore may be swabbed and then examined in the lab to make a final diagnosis. If the sores are not present, blood tests can find the herpes simplex virus.   TREATMENT   There is no cure for cold sores and no vaccine for the herpes simplex virus. Within 2 weeks, most cold sores go away on their own without treatment. Medicines cannot make the infection go away, but medicine can help relieve some of the pain associated with the sores, can work to stop the virus from multiplying, and can also shorten healing time. Medicine may be in the form of creams, gels, pills, or a shot.   HOME CARE INSTRUCTIONS   · Only take over-the-counter or prescription medicines for pain, discomfort, or fever as directed by your caregiver. Do not use aspirin.    · Use a cotton-tip swab to apply creams or gels to your sores.    · Do not touch the sores or pick the scabs. Wash your hands often. Do not touch your eyes without washing your hands first.    · Avoid kissing, oral sex, and sharing personal items until sores heal.    · Apply an ice pack on your sores for 10-15 minutes to ease any discomfort.    ·   Avoid hot, cold, or salty foods because they may hurt your mouth. Eat a soft, bland diet to avoid irritating the sores. Use a straw to drink if you have pain when drinking out of a glass.    · Keep sores clean and dry to prevent an infection of other tissues.    · Avoid the sun and limit stress if these things trigger outbreaks. If sun causes cold sores, apply sunscreen on the lips before being out in the sun.    SEEK MEDICAL CARE IF:   · You have a fever or persistent symptoms for more than 2-3 days.    · You have a fever and your symptoms suddenly get worse.    · You have pus, not clear fluid, coming from the sores.    · You have redness that is spreading.    · You have pain or irritation in your  eye.    · You get sores on your genitals.    · Your sores do not heal within 2 weeks.    · You have a weakened immune system.    · You have frequent recurrences of cold sores.    MAKE SURE YOU:   · Understand these instructions.  · Will watch your condition.  · Will get help right away if you are not doing well or get worse.  Document Released: 11/17/2000 Document Revised: 04/06/2014 Document Reviewed: 04/03/2012  Exit[employer]® Patient Information ©2015 [employer]. This information is not intended to replace advice given to you by your health care provider. Make sure you discuss any questions you have with your health care provider.

## 2015-04-19 NOTE — Progress Notes (Signed)
   Subjective:    Patient ID: Nicolas [name], male    DOB: 06-Jul-1936, 79 y.o.   MRN: [id number]  Mouth Lesions  The current episode started 5 to 7 days ago. The problem occurs continuously. The problem has been gradually worsening. The problem is moderate. The symptoms are relieved by acetaminophen. The symptoms are aggravated by eating and drinking. Associated symptoms include mouth sores. Pertinent negatives include no double vision, no eye itching, no congestion, no headaches, no sore throat, no eye discharge and no eye pain.      Review of Systems  Constitutional: Negative.   HENT: Positive for mouth sores. Negative for congestion and sore throat.   Eyes: Negative for double vision, pain, discharge and itching.  Respiratory: Negative.   Cardiovascular: Negative.   Gastrointestinal: Negative.   Endocrine: Negative.   Genitourinary: Negative.   Musculoskeletal: Negative.   Neurological: Negative.  Negative for headaches.  Hematological: Negative.   Psychiatric/Behavioral: Negative.   All other systems reviewed and are negative.      Objective:   Physical Exam  Constitutional: He is oriented to person, place, and time. He appears well-developed and well-nourished. No distress.  HENT:  Head: Normocephalic.  Right Ear: External ear normal.  Left Ear: External ear normal.  Ulcer Lesion on bottom left lip and inside of lower right side of pt's gum   Eyes: Pupils are equal, round, and reactive to light. Right eye exhibits no discharge. Left eye exhibits no discharge.  Neck: Normal range of motion. Neck supple. No thyromegaly present.  Cardiovascular: Normal rate, regular rhythm, normal heart sounds and intact distal pulses.   No murmur heard. Pulmonary/Chest: Effort normal and breath sounds normal. No respiratory distress. He has no wheezes.  Abdominal: Soft. Bowel sounds are normal. He exhibits no distension. There is no tenderness.  Musculoskeletal: Normal range of motion. He  exhibits no edema or tenderness.  Neurological: He is alert and oriented to person, place, and time. He has normal reflexes. No cranial nerve deficit.  Skin: Skin is warm and dry. No rash noted. No erythema.  Psychiatric: He has a normal mood and affect. His behavior is normal. Judgment and thought content normal.  Vitals reviewed.     BP 136/64 mmHg  Pulse 82  Temp(Src) 100 F (37.8 C) (Oral)  Ht 5\' 11"  (1.803 m)  Wt 206 lb (93.441 kg)  BMI 28.74 kg/m2     Assessment & Plan:  1. Herpes simplex -Do not drink or eat after anyone  -Stress management -Good hand hygiene  -RTO prn - valACYclovir (VALTREX) 1000 MG tablet; Take 2 tablets (2,000 mg total) by mouth 2 (two) times daily.  Dispense: 4 tablet; Refill: 0  Evelina Dun, FNP

## 2015-04-28 ENCOUNTER — Telehealth: Payer: Self-pay | Admitting: Family

## 2015-04-28 DIAGNOSIS — B009 Herpesviral infection, unspecified: Secondary | ICD-10-CM

## 2015-04-28 MED ORDER — VALACYCLOVIR HCL 1 G PO TABS: 2000.0000 mg | ORAL_TABLET | Freq: Two times a day (BID) | ORAL | Status: DC

## 2015-04-28 NOTE — Telephone Encounter (Signed)
New rx sent to pharmacy

## 2015-04-29 ENCOUNTER — Telehealth: Payer: Self-pay | Admitting: Family

## 2015-04-29 MED ORDER — ACYCLOVIR 5 % EX OINT: 1.0000 "application " | TOPICAL_OINTMENT | CUTANEOUS | Status: DC

## 2015-04-29 NOTE — Telephone Encounter (Signed)
Zovirax I sent the Rx to the pharmacy.

## 2015-04-29 NOTE — Telephone Encounter (Signed)
Patient aware rx sent to pharmacy.  

## 2015-09-14 ENCOUNTER — Telehealth: Payer: Self-pay | Admitting: *Deleted

## 2015-09-14 NOTE — Telephone Encounter (Signed)
Discussed with wife.  States that he is feeling a little better now. Encouraged ice chips and then to increase to clear liquids. Should be able to increase to light/bland foods tomorrow. Will need to avoid the flu shot in the future.  Wife stated understanding and agreement to plan.  She is aware that someone is on call tonight if he worsens.

## 2015-09-14 NOTE — Telephone Encounter (Signed)
The patient should drink fluids and take Tylenol and to ice over the area where the flu shot was given

## 2015-09-14 NOTE — Telephone Encounter (Signed)
Spoke with patient's wife.  She relayed that patient had flu shot at 11 am today and at 3:00 he developed nausea, vomiting, and dizziness.  States that he had the same problem after vaccination last year and it resolved on it's own in about 12 hrs.  Explained that I wasn't aware of this being a side effect of the flu shot but that I would consult with the provider on call and call her back.  He should avoid eating or drinking in the meantime.

## 2016-02-03 ENCOUNTER — Encounter: Payer: Self-pay | Admitting: Family

## 2016-02-03 ENCOUNTER — Ambulatory Visit (INDEPENDENT_AMBULATORY_CARE_PROVIDER_SITE_OTHER): Payer: Medicare Other | Admitting: Family

## 2016-02-03 VITALS — BP 161/74 | HR 59 | Temp 97.0°F | Ht 71.0 in | Wt 209.8 lb

## 2016-02-03 DIAGNOSIS — J0100 Acute maxillary sinusitis, unspecified: Secondary | ICD-10-CM | POA: Diagnosis not present

## 2016-02-03 MED ORDER — FLUTICASONE PROPIONATE 50 MCG/ACT NA SUSP: 2.0000 | Freq: Every day | NASAL | Status: DC

## 2016-02-03 MED ORDER — AMOXICILLIN-POT CLAVULANATE 875-125 MG PO TABS: 1.0000 | ORAL_TABLET | Freq: Two times a day (BID) | ORAL | Status: DC

## 2016-02-03 NOTE — Progress Notes (Signed)
   Subjective:    Patient ID: Nicolas [name], male    DOB: [birth year], 80 y.o.   MRN: [id number]  Sinus Problem This is a new problem. The current episode started in the past 7 days. The problem is unchanged. His pain is at a severity of 0/10. He is experiencing no pain. Associated symptoms include congestion, coughing, a hoarse voice, sinus pressure and a sore throat. Pertinent negatives include no chills, ear pain or headaches. Past treatments include nothing. The treatment provided no relief.      Review of Systems  Constitutional: Negative.  Negative for chills.  HENT: Positive for congestion, hoarse voice, sinus pressure and sore throat. Negative for ear pain.   Respiratory: Positive for cough.   Cardiovascular: Negative.   Gastrointestinal: Negative.   Endocrine: Negative.   Genitourinary: Negative.   Musculoskeletal: Negative.   Neurological: Negative.  Negative for headaches.  Hematological: Negative.   Psychiatric/Behavioral: Negative.   All other systems reviewed and are negative.      Objective:   Physical Exam  Constitutional: He is oriented to person, place, and time. He appears well-developed and well-nourished. No distress.  HENT:  Head: Normocephalic.  Right Ear: External ear normal.  Left Ear: External ear normal.  Nasal passage erythemas with mild swelling  Oropharynx erythemas  Eyes: Pupils are equal, round, and reactive to light. Right eye exhibits no discharge. Left eye exhibits no discharge.  Neck: Normal range of motion. Neck supple. No thyromegaly present.  Cardiovascular: Normal rate, regular rhythm, normal heart sounds and intact distal pulses.   No murmur heard. Pulmonary/Chest: Effort normal and breath sounds normal. No respiratory distress. He has no wheezes.  Abdominal: Soft. Bowel sounds are normal. He exhibits no distension. There is no tenderness.  Musculoskeletal: Normal range of motion. He exhibits no edema or tenderness.  Neurological: He is  alert and oriented to person, place, and time.  Skin: Skin is warm and dry. No rash noted. No erythema.  Psychiatric: He has a normal mood and affect. His behavior is normal. Judgment and thought content normal.  Vitals reviewed.     BP 161/74 mmHg  Pulse 59  Temp(Src) 97 F (36.1 C) (Oral)  Ht 5\' 11"  (1.803 m)  Wt 209 lb 12.8 oz (95.165 kg)  BMI 29.27 kg/m2     Assessment & Plan:  1. Acute maxillary sinusitis, recurrence not specified -- Take meds as prescribed - Use a cool mist humidifier  -Use saline nose sprays frequently -Saline irrigations of the nose can be very helpful if done frequently.  * 4X daily for 1 week*  * Use of a nettie pot can be helpful with this. Follow directions with this* -Force fluids -For any cough or congestion  Use plain Mucinex- regular strength or max strength is fine   * Children- consult with Pharmacist for dosing -For fever or aces or pains- take tylenol or ibuprofen appropriate for age and weight.  * for fevers greater than 101 orally you may alternate ibuprofen and tylenol every  3 hours. -Throat lozenges if help - fluticasone (FLONASE) 50 MCG/ACT nasal spray; Place 2 sprays into both nostrils daily.  Dispense: 16 g; Refill: 6 - amoxicillin-clavulanate (AUGMENTIN) 875-125 MG tablet; Take 1 tablet by mouth 2 (two) times daily.  Dispense: 14 tablet; Refill: 0  Evelina Dun, FNP

## 2016-02-03 NOTE — Patient Instructions (Signed)
Sinusitis, Adult Sinusitis is redness, soreness, and inflammation of the paranasal sinuses. Paranasal sinuses are air pockets within the bones of your face. They are located beneath your eyes, in the middle of your forehead, and above your eyes. In healthy paranasal sinuses, mucus is able to drain out, and air is able to circulate through them by way of your nose. However, when your paranasal sinuses are inflamed, mucus and air can become trapped. This can allow bacteria and other germs to grow and cause infection. Sinusitis can develop quickly and last only a short time (acute) or continue over a long period (chronic). Sinusitis that lasts for more than 12 weeks is considered chronic. CAUSES Causes of sinusitis include:  Allergies.  Structural abnormalities, such as displacement of the cartilage that separates your nostrils (deviated septum), which can decrease the air flow through your nose and sinuses and affect sinus drainage.  Functional abnormalities, such as when the small hairs (cilia) that line your sinuses and help remove mucus do not work properly or are not present. SIGNS AND SYMPTOMS Symptoms of acute and chronic sinusitis are the same. The primary symptoms are pain and pressure around the affected sinuses. Other symptoms include:  Upper toothache.  Earache.  Headache.  Bad breath.  Decreased sense of smell and taste.  A cough, which worsens when you are lying flat.  Fatigue.  Fever.  Thick drainage from your nose, which often is green and may contain pus (purulent).  Swelling and warmth over the affected sinuses. DIAGNOSIS Your health care provider will perform a physical exam. During your exam, your health care provider may perform any of the following to help determine if you have acute sinusitis or chronic sinusitis:  Look in your nose for signs of abnormal growths in your nostrils (nasal polyps).  Tap over the affected sinus to check for signs of  infection.  View the inside of your sinuses using an imaging device that has a light attached (endoscope). If your health care provider suspects that you have chronic sinusitis, one or more of the following tests may be recommended:  Allergy tests.  Nasal culture. A sample of mucus is taken from your nose, sent to a lab, and screened for bacteria.  Nasal cytology. A sample of mucus is taken from your nose and examined by your health care provider to determine if your sinusitis is related to an allergy. TREATMENT Most cases of acute sinusitis are related to a viral infection and will resolve on their own within 10 days. Sometimes, medicines are prescribed to help relieve symptoms of both acute and chronic sinusitis. These may include pain medicines, decongestants, nasal steroid sprays, or saline sprays. However, for sinusitis related to a bacterial infection, your health care provider will prescribe antibiotic medicines. These are medicines that will help kill the bacteria causing the infection. Rarely, sinusitis is caused by a fungal infection. In these cases, your health care provider will prescribe antifungal medicine. For some cases of chronic sinusitis, surgery is needed. Generally, these are cases in which sinusitis recurs more than 3 times per year, despite other treatments. HOME CARE INSTRUCTIONS  Drink plenty of water. Water helps thin the mucus so your sinuses can drain more easily.  Use a humidifier.  Inhale steam 3-4 times a day (for example, sit in the bathroom with the shower running).  Apply a warm, moist washcloth to your face 3-4 times a day, or as directed by your health care provider.  Use saline nasal sprays to help   moisten and clean your sinuses.  Take medicines only as directed by your health care provider.  If you were prescribed either an antibiotic or antifungal medicine, finish it all even if you start to feel better. SEEK IMMEDIATE MEDICAL CARE IF:  You have  increasing pain or severe headaches.  You have nausea, vomiting, or drowsiness.  You have swelling around your face.  You have vision problems.  You have a stiff neck.  You have difficulty breathing.   This information is not intended to replace advice given to you by your health care provider. Make sure you discuss any questions you have with your health care provider.   Document Released: 11/20/2005 Document Revised: 12/11/2014 Document Reviewed: 12/05/2011 Elsevier Interactive Patient Education 2016 [employer]  - Take meds as prescribed - Use a cool mist humidifier  -Use saline nose sprays frequently -Saline irrigations of the nose can be very helpful if done frequently.  * 4X daily for 1 week*  * Use of a nettie pot can be helpful with this. Follow directions with this* -Force fluids -For any cough or congestion  Use plain Mucinex- regular strength or max strength is fine   * Children- consult with Pharmacist for dosing -For fever or aces or pains- take tylenol or ibuprofen appropriate for age and weight.  * for fevers greater than 101 orally you may alternate ibuprofen and tylenol every  3 hours. -Throat lozenges if help   Tannen Vandezande, FNP   

## 2016-02-14 DIAGNOSIS — N401 Enlarged prostate with lower urinary tract symptoms: Secondary | ICD-10-CM | POA: Diagnosis not present

## 2016-02-14 DIAGNOSIS — N138 Other obstructive and reflux uropathy: Secondary | ICD-10-CM | POA: Diagnosis not present

## 2016-02-14 DIAGNOSIS — R972 Elevated prostate specific antigen [PSA]: Secondary | ICD-10-CM | POA: Diagnosis not present

## 2016-04-14 ENCOUNTER — Encounter: Payer: Self-pay | Admitting: Family Medicine

## 2016-04-14 ENCOUNTER — Ambulatory Visit (INDEPENDENT_AMBULATORY_CARE_PROVIDER_SITE_OTHER): Payer: Medicare Other | Admitting: Family Medicine

## 2016-04-14 VITALS — BP 164/64 | HR 54 | Temp 98.2°F | Ht 71.0 in | Wt 207.6 lb

## 2016-04-14 DIAGNOSIS — M5442 Lumbago with sciatica, left side: Secondary | ICD-10-CM

## 2016-04-14 MED ORDER — PREDNISONE 20 MG PO TABS: ORAL_TABLET | ORAL | Status: DC

## 2016-04-14 NOTE — Progress Notes (Signed)
BP 164/64 mmHg  Pulse 54  Temp(Src) 98.2 F (36.8 C) (Oral)  Ht 5\' 11"  (1.803 m)  Wt 207 lb 9.6 oz (94.167 kg)  BMI 28.97 kg/m2   Subjective:    Patient ID: Nicolas [name], male    DOB: [birth year], 80 y.o.   MRN: [id number]  HPI: Nicolas [name] is a 80 y.o. male presenting on 04/14/2016 for Back Pain   HPI Left lower back pain with sciatic nerve pain [place] axillary stepped in a hole of his left foot and since then he has been having left lower lumbar pain with the occasional pain going down the back of his right hamstring. He denies any numbness or weakness or difficulty ambulating. He says it is worse when he is in the upright sitting position. He denies any fevers or chills. The pain is 6 out of 10.  Relevant past medical, surgical, family and social history reviewed and updated as indicated. Interim medical history since our last visit reviewed. Allergies and medications reviewed and updated.  Review of Systems  Constitutional: Negative for fever.  HENT: Negative for ear discharge and ear pain.   Eyes: Negative for discharge and visual disturbance.  Respiratory: Negative for shortness of breath and wheezing.   Cardiovascular: Negative for chest pain and leg swelling.  Gastrointestinal: Negative for abdominal pain, diarrhea and constipation.  Genitourinary: Negative for difficulty urinating.  Musculoskeletal: Positive for myalgias and back pain. Negative for gait problem.  Skin: Negative for rash.  Neurological: Negative for syncope, light-headedness and headaches.  All other systems reviewed and are negative.   Per HPI unless specifically indicated above     Medication List       This list is accurate as of: 04/14/16  4:17 PM.  Always use your most recent med list.               finasteride 5 MG tablet  Commonly known as:  PROSCAR  Take 5 mg by mouth daily.     predniSONE 20 MG tablet  Commonly known as:  DELTASONE  2 po at  same time daily for 5 days     tamsulosin 0.4 MG Caps capsule  Commonly known as:  FLOMAX  Take 0.4 mg by mouth daily.           Objective:    BP 164/64 mmHg  Pulse 54  Temp(Src) 98.2 F (36.8 C) (Oral)  Ht 5\' 11"  (1.803 m)  Wt 207 lb 9.6 oz (94.167 kg)  BMI 28.97 kg/m2  Wt Readings from Last 3 Encounters:  04/14/16 207 lb 9.6 oz (94.167 kg)  02/03/16 209 lb 12.8 oz (95.165 kg)  04/19/15 206 lb (93.441 kg)    Physical Exam  Constitutional: He is oriented to person, place, and time. He appears well-developed and well-nourished. No distress.  Eyes: Conjunctivae and EOM are normal. Pupils are equal, round, and reactive to light. Right eye exhibits no discharge. No scleral icterus.  Cardiovascular: Normal rate, regular rhythm, normal heart sounds and intact distal pulses.   No murmur heard. Pulmonary/Chest: Effort normal and breath sounds normal. No respiratory distress. He has no wheezes.  Musculoskeletal: Normal range of motion. He exhibits tenderness. He exhibits no edema.       Lumbar back: He exhibits tenderness (Left lower lumbar tenderness over musculature. Negative straight leg raise.). He exhibits normal range of motion, no bony tenderness, no edema, no deformity and normal pulse.  Neurological: He  is alert and oriented to person, place, and time. Coordination normal.  Skin: Skin is warm and dry. No rash noted. He is not diaphoretic.  Psychiatric: He has a normal mood and affect. His behavior is normal.  Vitals reviewed.     Assessment & Plan:   Problem List Items Addressed This Visit    None    Visit Diagnoses    Left-sided low back pain with left-sided sciatica    -  Primary    Try prednisone, if not improved return and we'll discuss x-rays versus orthopedic referral.    Relevant Medications    predniSONE (DELTASONE) 20 MG tablet        Follow up plan: Return if symptoms worsen or fail to improve.  Counseling provided for all of the vaccine components No  orders of the defined types were placed in this encounter.    Caryl Pina, MD [facility] 04/14/2016, 4:17 PM

## 2016-05-15 DIAGNOSIS — L508 Other urticaria: Secondary | ICD-10-CM | POA: Diagnosis not present

## 2016-05-25 ENCOUNTER — Ambulatory Visit (INDEPENDENT_AMBULATORY_CARE_PROVIDER_SITE_OTHER): Payer: Medicare Other | Admitting: Family Medicine

## 2016-05-25 ENCOUNTER — Encounter: Payer: Self-pay | Admitting: Family Medicine

## 2016-05-25 VITALS — BP 140/60 | HR 89 | Temp 100.0°F | Ht 71.0 in | Wt 203.2 lb

## 2016-05-25 DIAGNOSIS — J209 Acute bronchitis, unspecified: Secondary | ICD-10-CM

## 2016-05-25 MED ORDER — HYDROCODONE-HOMATROPINE 5-1.5 MG/5ML PO SYRP: 5.0000 mL | ORAL_SOLUTION | Freq: Three times a day (TID) | ORAL | Status: DC | PRN

## 2016-05-25 MED ORDER — DOXYCYCLINE HYCLATE 100 MG PO TABS: 100.0000 mg | ORAL_TABLET | Freq: Two times a day (BID) | ORAL | Status: DC

## 2016-05-25 NOTE — Progress Notes (Signed)
   Subjective:    Patient ID: Nicolas [name], male    DOB: January 15, 1936, 80 y.o.   MRN: [id number]  HPI 80 year old gentleman with a three-week history of congestion and cough. For the last several days he's had some low-grade fever. Cough is generally productive in the mornings. By history it started in his sinuses and head and now is in his chest. There is no history of chronic heart or lung disease.  Patient Active Problem List   Diagnosis Date Noted  . Influenza with other respiratory manifestations 02/20/2014  . Pneumothorax, traumatic 02/20/2014  . Fracture of multiple ribs 02/20/2014  . Anemia 02/20/2014  . Syncope 02/19/2014  . Hematoma 02/19/2014  . Fever 02/19/2014  . Macrocytic anemia 02/19/2014   Outpatient Encounter Prescriptions as of 05/25/2016  Medication Sig  . finasteride (PROSCAR) 5 MG tablet Take 5 mg by mouth daily.  . tamsulosin (FLOMAX) 0.4 MG CAPS capsule Take 0.4 mg by mouth daily.  . [DISCONTINUED] predniSONE (DELTASONE) 20 MG tablet 2 po at same time daily for 5 days   No facility-administered encounter medications on file as of 05/25/2016.      Review of Systems  Constitutional: Positive for fever.  HENT: Positive for congestion.   Respiratory: Positive for cough.   Cardiovascular: Negative.   Genitourinary: Negative.   Neurological: Negative.   Psychiatric/Behavioral: Negative.        Objective:   Physical Exam  Constitutional: He appears well-developed and well-nourished.  HENT:  Mouth/Throat: Oropharynx is clear and moist.  Cardiovascular: Normal rate, regular rhythm and normal heart sounds.   Pulmonary/Chest: Effort normal. He has wheezes.  Neurological: He is alert.   BP 140/60 mmHg  Pulse 89  Temp(Src) 100 F (37.8 C) (Oral)  Ht 5\' 11"  (1.803 m)  Wt 203 lb 3.2 oz (92.171 kg)  BMI 28.35 kg/m2        Assessment & Plan:  1. Acute bronchitis, unspecified organism History and exam are consistent with bronchitis. Since he has had this  for 3 weeks I am inclined to treat. Rx doxycycline 100 mg twice a day 1 week. Mucinex take in the daytime and Hycodan to take at night as needed for cough suppression and to help him rest  Wardell Honour MD

## 2016-06-21 DIAGNOSIS — L500 Allergic urticaria: Secondary | ICD-10-CM | POA: Diagnosis not present

## 2016-07-10 ENCOUNTER — Encounter: Payer: Self-pay | Admitting: Family

## 2016-07-10 ENCOUNTER — Encounter (INDEPENDENT_AMBULATORY_CARE_PROVIDER_SITE_OTHER): Payer: Self-pay

## 2016-07-10 ENCOUNTER — Ambulatory Visit (INDEPENDENT_AMBULATORY_CARE_PROVIDER_SITE_OTHER): Payer: Medicare Other | Admitting: Family

## 2016-07-10 VITALS — BP 128/61 | HR 63 | Temp 97.6°F | Ht 71.0 in | Wt 201.0 lb

## 2016-07-10 DIAGNOSIS — L500 Allergic urticaria: Secondary | ICD-10-CM

## 2016-07-10 DIAGNOSIS — E663 Overweight: Secondary | ICD-10-CM | POA: Diagnosis not present

## 2016-07-10 MED ORDER — METHYLPREDNISOLONE ACETATE 80 MG/ML IJ SUSP
80.0000 mg | Freq: Once | INTRAMUSCULAR | Status: AC
  Administered 2016-07-10: 80 mg via INTRAMUSCULAR

## 2016-07-10 NOTE — Patient Instructions (Signed)
Hives Hives are itchy, red, swollen areas of the skin. They can vary in size and location on your body. Hives can come and go for hours or several days (acute hives) or for several weeks (chronic hives). Hives do not spread from person to person (noncontagious). They may get worse with scratching, exercise, and emotional stress. CAUSES   Allergic reaction to food, additives, or drugs.  Infections, including the common cold.  Illness, such as vasculitis, lupus, or thyroid disease.  Exposure to sunlight, heat, or cold.  Exercise.  Stress.  Contact with chemicals. SYMPTOMS   Red or white swollen patches on the skin. The patches may change size, shape, and location quickly and repeatedly.  Itching.  Swelling of the hands, feet, and face. This may occur if hives develop deeper in the skin. DIAGNOSIS  Your caregiver can usually tell what is wrong by performing a physical exam. Skin or blood tests may also be done to determine the cause of your hives. In some cases, the cause cannot be determined. TREATMENT  Mild cases usually get better with medicines such as antihistamines. Severe cases may require an emergency epinephrine injection. If the cause of your hives is known, treatment includes avoiding that trigger.  HOME CARE INSTRUCTIONS   Avoid causes that trigger your hives.  Take antihistamines as directed by your caregiver to reduce the severity of your hives. Non-sedating or low-sedating antihistamines are usually recommended. Do not drive while taking an antihistamine.  Take any other medicines prescribed for itching as directed by your caregiver.  Wear loose-fitting clothing.  Keep all follow-up appointments as directed by your caregiver. SEEK MEDICAL CARE IF:   You have persistent or severe itching that is not relieved with medicine.  You have painful or swollen joints. SEEK IMMEDIATE MEDICAL CARE IF:   You have a fever.  Your tongue or lips are swollen.  You have  trouble breathing or swallowing.  You feel tightness in the throat or chest.  You have abdominal pain. These problems may be the first sign of a life-threatening allergic reaction. Call your local emergency services (911 in [place] MAKE SURE YOU:   Understand these instructions.  Will watch your condition.  Will get help right away if you are not doing well or get worse.   This information is not intended to replace advice given to you by your health care provider. Make sure you discuss any questions you have with your health care provider.   Document Released: 11/20/2005 Document Revised: 11/25/2013 Document Reviewed: 02/13/2012 Elsevier Interactive Patient Education 2016 [employer]  

## 2016-07-10 NOTE — Progress Notes (Signed)
   Subjective:    Patient ID: Nicolas [name], male    DOB: May 30, 1936, 80 y.o.   MRN: [id number]  Pt presents to the office today with urticaria. PT went dermatologists three weeks ago and had a biopsy and was told it was "hives", but was not given anything for it.  Urticaria  This is a new problem. The current episode started more than 1 month ago. The problem has been waxing and waning since onset. The affected locations include the right arm, right upper leg, right lower leg, left upper leg and left lower leg. The rash is characterized by redness and itchiness. He was exposed to nothing. Pertinent negatives include no cough, diarrhea, facial edema, joint pain, nail changes, shortness of breath or sore throat. Past treatments include anti-itch cream. The treatment provided no relief.      Review of Systems  HENT: Negative for sore throat.   Respiratory: Negative for apnea, cough and shortness of breath.   Cardiovascular: Negative.  Negative for chest pain and leg swelling.  Gastrointestinal: Negative.  Negative for diarrhea.  Musculoskeletal: Negative for joint pain.  Skin: Positive for rash. Negative for nail changes.       Objective:   Physical Exam  Constitutional: He is oriented to person, place, and time. He appears well-developed and well-nourished. No distress.  Cardiovascular: Normal rate, regular rhythm, normal heart sounds and intact distal pulses.   No murmur heard. Pulmonary/Chest: Effort normal and breath sounds normal. No respiratory distress. He has no wheezes.  Abdominal: Soft. Bowel sounds are normal. He exhibits no distension. There is no tenderness.  Musculoskeletal: Normal range of motion. He exhibits no edema or tenderness.  Neurological: He is alert and oriented to person, place, and time. He has normal reflexes. No cranial nerve deficit.  Skin: Skin is warm and dry. Rash noted. Rash is urticarial (generalized urticarial on right arm and bilateral legs). No  erythema.  Psychiatric: He has a normal mood and affect. His behavior is normal. Judgment and thought content normal.  Vitals reviewed.   BP 128/61   Pulse 63   Temp 97.6 F (36.4 C) (Oral)   Ht '5\' 11"'$  (1.803 m)   Wt 201 lb (91.2 kg)   BMI 28.03 kg/m        Assessment & Plan:  1. Overweight (BMI 25.0-29.9) - CMP14+EGFR  2. Allergic urticaria -Start daily zyrtec -Encouraged pt to make food diary -Do not scratch -RTO prn  - CMP14+EGFR - Alpha-Gal Panel - Ambulatory referral to Allergy - methylPREDNISolone acetate (DEPO-MEDROL) injection 80 mg; Inject 1 mL (80 mg total) into the muscle once.  Evelina Dun, FNP

## 2016-07-12 LAB — ALPHA-GAL PANEL
Alpha Gal IgE*: <0.1 kU/L (ref <0.35)
Beef (Bos spp) IgE: <0.1 kU/L (ref <0.35)
Class Interpretation: 0
Class Interpretation: 0
Class Interpretation: 0
Lamb/Mutton (Ovis spp) IgE: <0.1 kU/L (ref <0.35)
Pork (Sus spp) IgE: <0.1 kU/L (ref <0.35)

## 2016-07-12 LAB — CMP14+EGFR
ALT: 10 IU/L (ref 0–44)
AST: 17 IU/L (ref 0–40)
Albumin/Globulin Ratio: 1.2 (ref 1.2–2.2)
Albumin: 4 g/dL (ref 3.5–4.8)
Alkaline Phosphatase: 76 IU/L (ref 39–117)
BUN/Creatinine Ratio: 16 (ref 10–24)
BUN: 11 mg/dL (ref 8–27)
Bilirubin Total: 0.7 mg/dL (ref 0.0–1.2)
CO2: 24 mmol/L (ref 18–29)
Calcium: 8.8 mg/dL (ref 8.6–10.2)
Chloride: 101 mmol/L (ref 96–106)
Creatinine, Ser: 0.7 mg/dL — ABNORMAL LOW (ref 0.76–1.27)
GFR calc Af Amer: 104 mL/min/1.73 (ref >59)
GFR calc non Af Amer: 90 mL/min/1.73 (ref >59)
Globulin, Total: 3.3 g/dL (ref 1.5–4.5)
Glucose: 96 mg/dL (ref 65–99)
Potassium: 4.3 mmol/L (ref 3.5–5.2)
Sodium: 139 mmol/L (ref 134–144)
Total Protein: 7.3 g/dL (ref 6.0–8.5)

## 2016-07-14 ENCOUNTER — Telehealth: Payer: Self-pay | Admitting: *Deleted

## 2016-07-14 NOTE — Telephone Encounter (Signed)
Pt notified of results Verbalizes understanding 

## 2016-07-14 NOTE — Telephone Encounter (Signed)
-----   Message from Sharion Balloon, [place] sent at 07/12/2016  7:29 PM EDT ----- Negative meat allergy

## 2016-11-03 ENCOUNTER — Telehealth: Payer: Self-pay | Admitting: Family

## 2016-11-03 NOTE — Telephone Encounter (Signed)
Pt is allergic to flu vaccine

## 2017-02-14 DIAGNOSIS — R972 Elevated prostate specific antigen [PSA]: Secondary | ICD-10-CM | POA: Diagnosis not present

## 2017-02-14 DIAGNOSIS — N401 Enlarged prostate with lower urinary tract symptoms: Secondary | ICD-10-CM | POA: Diagnosis not present

## 2017-02-14 DIAGNOSIS — N138 Other obstructive and reflux uropathy: Secondary | ICD-10-CM | POA: Diagnosis not present

## 2018-02-19 DIAGNOSIS — N138 Other obstructive and reflux uropathy: Secondary | ICD-10-CM | POA: Diagnosis not present

## 2018-02-19 DIAGNOSIS — N401 Enlarged prostate with lower urinary tract symptoms: Secondary | ICD-10-CM | POA: Diagnosis not present

## 2018-02-19 DIAGNOSIS — R972 Elevated prostate specific antigen [PSA]: Secondary | ICD-10-CM | POA: Diagnosis not present

## 2018-06-26 DIAGNOSIS — Z8521 Personal history of malignant neoplasm of larynx: Secondary | ICD-10-CM | POA: Diagnosis not present

## 2018-06-26 DIAGNOSIS — H3530 Unspecified macular degeneration: Secondary | ICD-10-CM | POA: Diagnosis not present

## 2018-06-26 DIAGNOSIS — C19 Malignant neoplasm of rectosigmoid junction: Secondary | ICD-10-CM | POA: Diagnosis not present

## 2018-06-26 DIAGNOSIS — Z87891 Personal history of nicotine dependence: Secondary | ICD-10-CM | POA: Diagnosis not present

## 2018-06-26 DIAGNOSIS — Z433 Encounter for attention to colostomy: Secondary | ICD-10-CM | POA: Diagnosis not present

## 2018-06-26 DIAGNOSIS — J439 Emphysema, unspecified: Secondary | ICD-10-CM | POA: Diagnosis not present

## 2018-06-26 DIAGNOSIS — D469 Myelodysplastic syndrome, unspecified: Secondary | ICD-10-CM | POA: Diagnosis not present

## 2018-06-26 DIAGNOSIS — Z9181 History of falling: Secondary | ICD-10-CM | POA: Diagnosis not present

## 2018-06-28 ENCOUNTER — Other Ambulatory Visit: Payer: Self-pay

## 2018-06-28 ENCOUNTER — Emergency Department (HOSPITAL_COMMUNITY): Payer: Non-veteran care

## 2018-06-28 ENCOUNTER — Encounter (HOSPITAL_COMMUNITY): Payer: Self-pay

## 2018-06-28 ENCOUNTER — Inpatient Hospital Stay (HOSPITAL_COMMUNITY): Payer: Non-veteran care

## 2018-06-28 ENCOUNTER — Inpatient Hospital Stay (HOSPITAL_COMMUNITY)
Admission: EM | Admit: 2018-06-28 | Discharge: 2018-07-05 | DRG: 871 | Disposition: A | Discharge status: Home Health | Payer: Non-veteran care | Attending: Internal Medicine | Admitting: Internal Medicine

## 2018-06-28 DIAGNOSIS — D696 Thrombocytopenia, unspecified: Secondary | ICD-10-CM | POA: Diagnosis present

## 2018-06-28 DIAGNOSIS — Z85038 Personal history of other malignant neoplasm of large intestine: Secondary | ICD-10-CM

## 2018-06-28 DIAGNOSIS — K9409 Other complications of colostomy: Secondary | ICD-10-CM | POA: Diagnosis present

## 2018-06-28 DIAGNOSIS — Z9049 Acquired absence of other specified parts of digestive tract: Secondary | ICD-10-CM | POA: Diagnosis not present

## 2018-06-28 DIAGNOSIS — R5381 Other malaise: Secondary | ICD-10-CM | POA: Diagnosis not present

## 2018-06-28 DIAGNOSIS — Z887 Allergy status to serum and vaccine status: Secondary | ICD-10-CM

## 2018-06-28 DIAGNOSIS — L0390 Cellulitis, unspecified: Secondary | ICD-10-CM | POA: Diagnosis present

## 2018-06-28 DIAGNOSIS — Z79899 Other long term (current) drug therapy: Secondary | ICD-10-CM | POA: Diagnosis not present

## 2018-06-28 DIAGNOSIS — E876 Hypokalemia: Secondary | ICD-10-CM | POA: Diagnosis present

## 2018-06-28 DIAGNOSIS — A419 Sepsis, unspecified organism: Principal | ICD-10-CM | POA: Diagnosis present

## 2018-06-28 DIAGNOSIS — R6521 Severe sepsis with septic shock: Secondary | ICD-10-CM | POA: Diagnosis present

## 2018-06-28 DIAGNOSIS — Z933 Colostomy status: Secondary | ICD-10-CM

## 2018-06-28 DIAGNOSIS — K4090 Unilateral inguinal hernia, without obstruction or gangrene, not specified as recurrent: Secondary | ICD-10-CM | POA: Diagnosis not present

## 2018-06-28 DIAGNOSIS — E872 Acidosis, unspecified: Secondary | ICD-10-CM | POA: Diagnosis present

## 2018-06-28 DIAGNOSIS — L03311 Cellulitis of abdominal wall: Secondary | ICD-10-CM | POA: Diagnosis not present

## 2018-06-28 DIAGNOSIS — R918 Other nonspecific abnormal finding of lung field: Secondary | ICD-10-CM | POA: Diagnosis not present

## 2018-06-28 DIAGNOSIS — T8141XA Infection following a procedure, superficial incisional surgical site, initial encounter: Secondary | ICD-10-CM | POA: Diagnosis present

## 2018-06-28 DIAGNOSIS — N400 Enlarged prostate without lower urinary tract symptoms: Secondary | ICD-10-CM | POA: Diagnosis present

## 2018-06-28 DIAGNOSIS — I959 Hypotension, unspecified: Secondary | ICD-10-CM | POA: Diagnosis present

## 2018-06-28 DIAGNOSIS — D649 Anemia, unspecified: Secondary | ICD-10-CM | POA: Diagnosis not present

## 2018-06-28 DIAGNOSIS — R3914 Feeling of incomplete bladder emptying: Secondary | ICD-10-CM | POA: Diagnosis not present

## 2018-06-28 DIAGNOSIS — R000 Tachycardia, unspecified: Secondary | ICD-10-CM | POA: Diagnosis not present

## 2018-06-28 DIAGNOSIS — T8149XA Infection following a procedure, other surgical site, initial encounter: Secondary | ICD-10-CM | POA: Diagnosis not present

## 2018-06-28 DIAGNOSIS — R6520 Severe sepsis without septic shock: Secondary | ICD-10-CM | POA: Diagnosis present

## 2018-06-28 DIAGNOSIS — D62 Acute posthemorrhagic anemia: Secondary | ICD-10-CM | POA: Diagnosis present

## 2018-06-28 DIAGNOSIS — D72829 Elevated white blood cell count, unspecified: Secondary | ICD-10-CM | POA: Diagnosis not present

## 2018-06-28 DIAGNOSIS — R509 Fever, unspecified: Secondary | ICD-10-CM | POA: Diagnosis present

## 2018-06-28 DIAGNOSIS — J9811 Atelectasis: Secondary | ICD-10-CM | POA: Diagnosis not present

## 2018-06-28 DIAGNOSIS — I499 Cardiac arrhythmia, unspecified: Secondary | ICD-10-CM | POA: Diagnosis not present

## 2018-06-28 DIAGNOSIS — N401 Enlarged prostate with lower urinary tract symptoms: Secondary | ICD-10-CM | POA: Diagnosis not present

## 2018-06-28 DIAGNOSIS — Z452 Encounter for adjustment and management of vascular access device: Secondary | ICD-10-CM | POA: Diagnosis not present

## 2018-06-28 DIAGNOSIS — R0902 Hypoxemia: Secondary | ICD-10-CM | POA: Diagnosis not present

## 2018-06-28 HISTORY — DX: Malignant (primary) neoplasm, unspecified: C80.1

## 2018-06-28 HISTORY — DX: Malignant neoplasm of colon, unspecified: C18.9

## 2018-06-28 LAB — CBC WITH DIFFERENTIAL/PLATELET
BASOS ABS: 0 10*3/uL (ref 0.0–0.1)
Basophils Relative: 0 %
EOS ABS: 0.1 10*3/uL (ref 0.0–0.7)
Eosinophils Relative: 0 %
HCT: 30.8 % — ABNORMAL LOW (ref 39.0–52.0)
Hemoglobin: 9.2 g/dL — ABNORMAL LOW (ref 13.0–17.0)
Lymphocytes Relative: 3 %
Lymphs Abs: 0.5 10*3/uL (ref 0.7–4.0)
MCH: 33 pg (ref 26.0–34.0)
MCHC: 29.9 g/dL — AB (ref 30.0–36.0)
MCV: 110.4 fL — ABNORMAL HIGH (ref 78.0–100.0)
MONO ABS: 2.8 10*3/uL (ref 0.1–1.0)
Monocytes Relative: 17 %
Neutro Abs: 13.5 10*3/uL (ref 1.7–7.7)
Neutrophils Relative %: 80 %
Platelets: 135 10*3/uL — ABNORMAL LOW (ref 150–400)
RBC: 2.79 MIL/uL — AB (ref 4.22–5.81)
RDW: 19.7 % — AB (ref 11.5–15.5)
WBC: 16.8 10*3/uL — AB (ref 4.0–10.5)

## 2018-06-28 LAB — URINALYSIS, ROUTINE W REFLEX MICROSCOPIC
Bacteria, UA: NONE SEEN
Bilirubin Urine: NEGATIVE
GLUCOSE, UA: NEGATIVE mg/dL
Ketones, ur: NEGATIVE mg/dL
Leukocytes, UA: NEGATIVE
Nitrite: NEGATIVE
PH: 5 (ref 5.0–8.0)
Protein, ur: NEGATIVE mg/dL
SPECIFIC GRAVITY, URINE: 1.014 (ref 1.005–1.030)

## 2018-06-28 LAB — ETHANOL: Alcohol, Ethyl (B): <10 mg/dL (ref <10)

## 2018-06-28 LAB — COMPREHENSIVE METABOLIC PANEL
ALBUMIN: 3 g/dL — AB (ref 3.5–5.0)
ALK PHOS: 111 U/L (ref 38–126)
ALT: 26 U/L (ref 0–44)
AST: 22 U/L (ref 15–41)
Anion gap: 9 (ref 5–15)
BILIRUBIN TOTAL: 1.5 mg/dL — AB (ref 0.3–1.2)
BUN: 15 mg/dL (ref 8–23)
CALCIUM: 7.9 mg/dL — AB (ref 8.9–10.3)
CO2: 21 mmol/L — ABNORMAL LOW (ref 22–32)
CREATININE: 0.99 mg/dL (ref 0.61–1.24)
Chloride: 109 mmol/L (ref 98–111)
GFR calc Af Amer: >60 mL/min (ref >60)
GLUCOSE: 109 mg/dL — AB (ref 70–99)
Potassium: 3.2 mmol/L — ABNORMAL LOW (ref 3.5–5.1)
Sodium: 139 mmol/L (ref 135–145)
TOTAL PROTEIN: 6.3 g/dL — AB (ref 6.5–8.1)

## 2018-06-28 LAB — LACTIC ACID, PLASMA
Lactic Acid, Venous: 2.5 mmol/L (ref 0.5–1.9)
Lactic Acid, Venous: 3.2 mmol/L (ref 0.5–1.9)
Lactic Acid, Venous: 3.6 mmol/L (ref 0.5–1.9)

## 2018-06-28 LAB — RAPID URINE DRUG SCREEN, HOSP PERFORMED
AMPHETAMINES: NOT DETECTED
Barbiturates: NOT DETECTED
Benzodiazepines: NOT DETECTED
Cocaine: NOT DETECTED
OPIATES: NOT DETECTED
Tetrahydrocannabinol: NOT DETECTED

## 2018-06-28 LAB — ACETAMINOPHEN LEVEL

## 2018-06-28 LAB — PROTIME-INR
INR: 1.39
PROTHROMBIN TIME: 16.9 s — AB (ref 11.4–15.2)

## 2018-06-28 LAB — TROPONIN I: TROPONIN I: 0.03 ng/mL — AB (ref <0.03)

## 2018-06-28 LAB — MRSA PCR SCREENING: MRSA BY PCR: NEGATIVE

## 2018-06-28 LAB — SALICYLATE LEVEL

## 2018-06-28 MED ORDER — FINASTERIDE 5 MG PO TABS
5.0000 mg | ORAL_TABLET | Freq: Every day | ORAL | Status: DC
  Administered 2018-06-29 – 2018-07-05 (×6): 5 mg via ORAL
  Filled 2018-06-28 (×10): qty 1

## 2018-06-28 MED ORDER — SODIUM CHLORIDE 0.9 % IV BOLUS
500.0000 mL | Freq: Once | INTRAVENOUS | Status: AC
  Administered 2018-06-28: 500 mL via INTRAVENOUS

## 2018-06-28 MED ORDER — ONDANSETRON HCL 4 MG PO TABS: 4.0000 mg | ORAL_TABLET | Freq: Four times a day (QID) | ORAL | Status: DC | PRN

## 2018-06-28 MED ORDER — ACETAMINOPHEN 500 MG PO TABS
1000.0000 mg | ORAL_TABLET | Freq: Once | ORAL | Status: AC
  Administered 2018-06-28: 1000 mg via ORAL
  Filled 2018-06-28: qty 2

## 2018-06-28 MED ORDER — SODIUM CHLORIDE 0.9 % IV SOLN
3.0000 g | Freq: Four times a day (QID) | INTRAVENOUS | Status: DC
  Administered 2018-06-28 – 2018-06-29 (×3): 3 g via INTRAVENOUS
  Filled 2018-06-28 (×12): qty 3

## 2018-06-28 MED ORDER — ONDANSETRON HCL 4 MG/2ML IJ SOLN: 4.0000 mg | Freq: Four times a day (QID) | INTRAMUSCULAR | Status: DC | PRN

## 2018-06-28 MED ORDER — SODIUM CHLORIDE 0.9% FLUSH: 10.0000 mL | INTRAVENOUS | Status: DC | PRN

## 2018-06-28 MED ORDER — NOREPINEPHRINE 4 MG/250ML-% IV SOLN
0.0000 ug/min | INTRAVENOUS | Status: DC
  Administered 2018-06-28: 2 ug/min via INTRAVENOUS
  Administered 2018-06-29: 6 ug/min via INTRAVENOUS
  Administered 2018-06-29: 5 ug/min via INTRAVENOUS
  Filled 2018-06-28 (×3): qty 250

## 2018-06-28 MED ORDER — ACETAMINOPHEN 325 MG PO TABS
650.0000 mg | ORAL_TABLET | Freq: Four times a day (QID) | ORAL | Status: DC | PRN
  Administered 2018-06-30: 650 mg via ORAL
  Filled 2018-06-28: qty 2

## 2018-06-28 MED ORDER — IOPAMIDOL (ISOVUE-300) INJECTION 61%
100.0000 mL | Freq: Once | INTRAVENOUS | Status: AC | PRN
  Administered 2018-06-28: 100 mL via INTRAVENOUS

## 2018-06-28 MED ORDER — ACETAMINOPHEN 650 MG RE SUPP: 650.0000 mg | Freq: Four times a day (QID) | RECTAL | Status: DC | PRN

## 2018-06-28 MED ORDER — DOCUSATE SODIUM 100 MG PO CAPS
100.0000 mg | ORAL_CAPSULE | Freq: Two times a day (BID) | ORAL | Status: DC
  Administered 2018-06-28 – 2018-07-05 (×10): 100 mg via ORAL
  Filled 2018-06-28 (×10): qty 1

## 2018-06-28 MED ORDER — SODIUM CHLORIDE 0.9% FLUSH
10.0000 mL | Freq: Two times a day (BID) | INTRAVENOUS | Status: DC
  Administered 2018-06-28 – 2018-07-04 (×13): 10 mL

## 2018-06-28 MED ORDER — POTASSIUM CHLORIDE 10 MEQ/100ML IV SOLN
10.0000 meq | INTRAVENOUS | Status: AC
  Administered 2018-06-28 (×3): 10 meq via INTRAVENOUS
  Filled 2018-06-28 (×4): qty 100

## 2018-06-28 MED ORDER — VANCOMYCIN HCL IN DEXTROSE 1-5 GM/200ML-% IV SOLN: 1000.0000 mg | Freq: Once | INTRAVENOUS | Status: DC

## 2018-06-28 MED ORDER — PIPERACILLIN-TAZOBACTAM 3.375 G IVPB 30 MIN
3.3750 g | Freq: Once | INTRAVENOUS | Status: AC
  Administered 2018-06-28: 3.375 g via INTRAVENOUS
  Filled 2018-06-28: qty 50

## 2018-06-28 MED ORDER — ENOXAPARIN SODIUM 40 MG/0.4ML ~~LOC~~ SOLN
40.0000 mg | SUBCUTANEOUS | Status: DC
  Administered 2018-06-28 – 2018-06-29 (×2): 40 mg via SUBCUTANEOUS
  Filled 2018-06-28 (×2): qty 0.4

## 2018-06-28 MED ORDER — CHLORHEXIDINE GLUCONATE CLOTH 2 % EX PADS
6.0000 | MEDICATED_PAD | Freq: Every day | CUTANEOUS | Status: DC
  Administered 2018-06-29 – 2018-07-04 (×5): 6 via TOPICAL

## 2018-06-28 MED ORDER — SODIUM CHLORIDE 0.9 % IV SOLN
1000.0000 mL | INTRAVENOUS | Status: DC
  Administered 2018-06-28 – 2018-06-29 (×2): 1000 mL via INTRAVENOUS

## 2018-06-28 MED ORDER — SODIUM CHLORIDE 0.9 % IV BOLUS
1500.0000 mL | Freq: Once | INTRAVENOUS | Status: AC
  Administered 2018-06-28: 1500 mL via INTRAVENOUS

## 2018-06-28 MED ORDER — TAMSULOSIN HCL 0.4 MG PO CAPS
0.4000 mg | ORAL_CAPSULE | Freq: Every day | ORAL | Status: DC
  Administered 2018-06-29 – 2018-07-05 (×6): 0.4 mg via ORAL
  Filled 2018-06-28 (×6): qty 1

## 2018-06-28 MED ORDER — SODIUM CHLORIDE 0.9 % IV SOLN
1500.0000 mg | Freq: Once | INTRAVENOUS | Status: AC
  Administered 2018-06-28: 1500 mg via INTRAVENOUS
  Filled 2018-06-28: qty 1500

## 2018-06-28 MED ORDER — POTASSIUM CHLORIDE 10 MEQ/100ML IV SOLN
10.0000 meq | INTRAVENOUS | Status: DC
  Administered 2018-06-28: 10 meq via INTRAVENOUS
  Filled 2018-06-28: qty 100

## 2018-06-28 MED ORDER — VANCOMYCIN HCL IN DEXTROSE 1-5 GM/200ML-% IV SOLN
1000.0000 mg | Freq: Two times a day (BID) | INTRAVENOUS | Status: DC
  Administered 2018-06-28: 1000 mg via INTRAVENOUS
  Filled 2018-06-28: qty 200

## 2018-06-28 MED ORDER — SODIUM CHLORIDE 0.9 % IV SOLN
1000.0000 mL | INTRAVENOUS | Status: DC
  Administered 2018-06-28: 1000 mL via INTRAVENOUS

## 2018-06-28 MED ORDER — TRAZODONE HCL 50 MG PO TABS
25.0000 mg | ORAL_TABLET | Freq: Every evening | ORAL | Status: DC | PRN
Start: 2018-06-28 — End: 2018-07-05
  Administered 2018-06-28 – 2018-07-04 (×5): 25 mg via ORAL
  Filled 2018-06-28 (×5): qty 1

## 2018-06-28 NOTE — H&P (Signed)
History and Physical  Nicolas [name] OJJ:[id number] DOB: [birth year] DOA: 06/28/2018  Referring physician: Thurnell Garbe PCP: Sharion Balloon, FNP   Chief Complaint: Fever   Historian: Pt is very poor historian and wife not at bedside  HPI: Nicolas [name] is a 82 y.o. male with colon cancer who is recently s/p colectomy with colostomy on 06/21/18 done at the [place] who presented to ED by EMS with complaints of fever up to 102 at home.  The says that he has had gradual rising in temperature since yesterday.  He reports no cough, no chest pain, no SOB, no palpitations, no neck or back pain and no vision changes.  He reports that his ostomy has been working and no blood seen in ostomy.  The patient says that he fell down a few days ago and bruised his left side but was seen by his PCP at [facility] and was told there were no serious injuries.    ED Course: Pt had a temp of 103.  He had elevated lactic acid up to 3.5.  He was started on sepsis protocol with IV fluids and IV broad spectrum antibiotics.  He had a CT abdomen with reported normal postoperative changes.  He is noted to be becoming more hypotensive in ED.  He is being admitted to stepdown ICU for severe sepsis.    Review of Systems: All systems reviewed and apart from history of presenting illness, are negative but patient is poor historian.  Past Medical History:  Diagnosis Date  . Anemia   . Anemia   . Cancer ([place])   . Colon cancer ([place])   . Prostate atrophy   . Syncope and collapse 02/2014   Past Surgical History:  Procedure Laterality Date  . CIRCUMCISION    . COLECTOMY    . COLONOSCOPY    . COLOSTOMY     Social History:  reports that he has quit smoking. He has never used smokeless tobacco. He reports that he does not drink alcohol or use drugs.  Allergies  Allergen Reactions  . Influenza Vaccines Nausea And Vomiting    Nausea and vomiting hours after flu shot two years in a row    Family History  Problem Relation Age of Onset  .  CAD Mother   . Pancreatic cancer Father   . Stroke Neg Hx     Prior to Admission medications   Medication Sig Start Date End Date Taking? Authorizing Provider  finasteride (PROSCAR) 5 MG tablet Take 5 mg by mouth daily. 02/17/14   [provider]  tamsulosin (FLOMAX) 0.4 MG CAPS capsule Take 0.4 mg by mouth daily. 02/17/14   [provider]   Physical Exam: Vitals:   06/28/18 1140 06/28/18 1143 06/28/18 1200 06/28/18 1245  BP: (!) 94/52 (!) 94/52 (!) 124/47   Pulse: (!) 105 (!) 103 98 100  Resp:  (!) 26 (!) 24 (!) 28  Temp: (!) 103 F (39.4 C)     TempSrc: Oral     SpO2: 93% 95% 93% 95%  Weight:      Height:        General exam: Moderately built and nourished patient, lying comfortably supine on the gurney in no obvious distress.  Pt is confused and possibly demented.   Head, eyes and ENT: Nontraumatic and normocephalic. Pupils equally reacting to light and accommodation. Oral mucosa very dry.  Neck: Supple. No JVD, carotid bruit or thyromegaly.  Lymphatics: No lymphadenopathy.  Respiratory system: rales RUL.  No increased work of breathing.  Cardiovascular system: S1 and S2 heard, RRR. No JVD, murmurs.  Gastrointestinal system: Abdomen is nondistended, soft and nontender. Normal bowel sounds heard. No organomegaly or masses appreciated. Ostomy present with soft brown stool - skin surrounding ostomy is red, appears infected, skin around staples appears red, possibly infected, no drainage seen.   Central nervous system: Alert and oriented. No focal neurological deficits.  Extremities: Symmetric 5 x 5 power. Peripheral pulses symmetrically felt.   Skin: red, warm areas around ostomy and staples as noted above.   Musculoskeletal system: Negative exam.  Psychiatry: Pleasant and cooperative.  Labs on Admission:  Basic Metabolic Panel: Recent Labs  Lab 06/28/18 1045  NA 139  K 3.2*  CL 109  CO2 21*  GLUCOSE 109*  BUN 15  CREATININE 0.99  CALCIUM  7.9*   Liver Function Tests: Recent Labs  Lab 06/28/18 1045  AST 22  ALT 26  ALKPHOS 111  BILITOT 1.5*  PROT 6.3*  ALBUMIN 3.0*   No results for input(s): LIPASE, AMYLASE in the last 168 hours. No results for input(s): AMMONIA in the last 168 hours. CBC: Recent Labs  Lab 06/28/18 1045  WBC 16.8*  NEUTROABS 13.5  HGB 9.2*  HCT 30.8*  MCV 110.4*  PLT 135*   Cardiac Enzymes: Recent Labs  Lab 06/28/18 1105  TROPONINI 0.03*    BNP (last 3 results) No results for input(s): PROBNP in the last 8760 hours. CBG: No results for input(s): GLUCAP in the last 168 hours.  Radiological Exams on Admission: Ct Abdomen Pelvis W Contrast  Result Date: 06/28/2018 CLINICAL DATA:  82 year old male postoperative day 9 from colectomy and colostomy for colon cancer performed at the [place]. Fever, abdominal pain, tachycardia. EXAM: CT ABDOMEN AND PELVIS WITH CONTRAST TECHNIQUE: Multidetector CT imaging of the abdomen and pelvis was performed using the standard protocol following bolus administration of intravenous contrast. 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 Resolved small left pleural effusion since that time. Mild cardiomegaly is increased. No pericardial effusion. Calcified coronary artery atherosclerosis is evident. Hepatobiliary: Stable 15 millimeter hypodense area along the anterior right liver dome since 2015, benign. Stable similar small peripheral hypodense areas along the falciform ligament and anterior right lobe (images 25 and 36, respectively). Negative gallbladder. Pancreas: Negative. Spleen: Mild splenomegaly, otherwise negative. Adrenals/Urinary Tract: Small intermediate density right adrenal nodule measuring 10 millimeters is indeterminate and was not definitely present in 2015. The left adrenal  appears normal. Mild bilateral nonspecific perinephric stranding. Symmetric renal contrast enhancement and excretion. Decompressed proximal ureters. Diminutive urinary bladder containing non dependent gas (series 2, image 81). No perivesical stranding. Stomach/Bowel: There is what appears to be a thick-walled blind-ending rectum in the pelvis. There is trace adjacent pneumoperitoneum (series 2, image 81) and a small volume of free fluid. The rectal stump is difficult to differentiate from nondilated small bowel in the pelvis. There is a left side descending colostomy with no adverse features. The upstream large bowel is decompressed. The transverse colon is redundant. Normal appendix. Motion artifact in the lower abdomen. Decompressed terminal ileum. Decompressed distal small bowel is partially within the small or developing right inguinal hernia as demonstrated on coronal image 30. Similar but less pronounced bulge at the left inguinal ring with adjacent small bowel. The stomach and duodenum are decompressed. The proximal jejunum is mildly dilated  with gas and fluid measuring 3 centimeters diameter. There is a gradual transition to decompressed mid and distal small bowel. No abdominal free fluid. Vascular/Lymphatic: Aortoiliac calcified atherosclerosis. The major arterial structures in the abdomen and pelvis are patent. Portal venous system is patent. No lymphadenopathy. Reproductive: Marginal right inguinal hernia which contains a small volume of pneumoperitoneum (series 2, image 81). Other: Small volume pelvic free fluid.  Mild presacral stranding. Postoperative changes to the ventral abdominal wall with stranding. Trace fluid subjacent to the skin staples in the midline. No associated rim enhancement. The Musculoskeletal: Chronic lower posterior left rib fractures. Flowing osteophytes in the spine. Chronic L5-S1 ankylosis. No acute osseous abnormality identified. IMPRESSION: 1. Postoperative changes to the  abdomen with descending colostomy and what appears to be a blind ending rectum. Mild rectal wall thickening, trace pneumoperitoneum and free fluid appear within normal limits for the postoperative state. 2. Proximal small bowel ileus suspected. No evidence of obstruction at the colostomy. Small bilateral inguinal hernias do marginally involve the distal small bowel, but do not appear consequential at this time. 3. Postoperative changes to the ventral abdomen likewise appear within normal limits. 4. Small indeterminate 10 mm right adrenal nodule. Small chronic low-density areas in the liver are stable since 2015 and benign. 5. Chronic right lung base fibrothorax. Aortic Atherosclerosis (ICD10-I70.0). Electronically Signed   By: Genevie Ann M.D.   On: 06/28/2018 13:00   Dg Chest Port 1 View  Result Date: 06/28/2018 CLINICAL DATA:  Fever.  History of colon carcinoma EXAM: PORTABLE CHEST 1 VIEW COMPARISON:  February 20, 2014 FINDINGS: There is atelectatic change in the right base. Lungs elsewhere are clear. Heart is borderline prominent with pulmonary vascularity normal. No adenopathy. There is aortic atherosclerosis. There is degenerative change in each shoulder. IMPRESSION: Right base atelectasis. No edema or consolidation. Stable cardiac prominence. There is aortic atherosclerosis. Aortic Atherosclerosis (ICD10-I70.0). Electronically Signed   By: Lowella Grip III M.D.   On: 06/28/2018 11:13   Assessment/Plan Principal Problem:   Severe sepsis (HCC) Active Problems:   Fever   Anemia   Leukocytosis   BPH (benign prostatic hyperplasia)   Lactic acidosis   Cellulitis   Thrombocytopenia (HCC)  1. Severe Sepsis - suspected from cellulitis near wound closure sites. Follow blood cultures, bolusing IV fluids, IV antibiotics Unasyn/Vanc ordered.  Urine culture pending.   2. Leukocytosis - secondary to sepsis and cellulitis infection. 3. Hypotension - continue bolus IV fluids, ask IV team to place PICC  line 4. Anemia - Possibly post op given recent colectomy, follow closely.  5. BPH - resume home medications, follow I/Os.  6. Fever - tylenol ordered as needed.  7. Thrombocytopenia - ?reactive, follow CBC.  8. Lactic acidosis - treating with IV fluid boluses per sepsis protocol.   DVT Prophylaxis: lovenox  Code Status: Full   Family Communication: wife left before I could speak with her  Disposition Plan: Stepdown ICU   Critical Care Time spent: 27 mins  Irwin Brakeman, MD [facility] Pager [phone]  If 7PM-7AM, please contact night-coverage [email or web address] Password [email or web address] 06/28/2018, 1:54 PM

## 2018-06-28 NOTE — Progress Notes (Signed)
Per RN surgeon is currently placing central line.  Requested PICC order be cancelled.  Carolee Rota, RN [facility]

## 2018-06-28 NOTE — ED Notes (Signed)
Date and time results received: 06/28/18 1145 (use smartphrase ".now" to insert current time)  Test: Lactic Acid  Critical Value: 2.5  Name of Provider Notified: Thurnell Garbe MD  Orders Received? Or Actions Taken?: none

## 2018-06-28 NOTE — ED Provider Notes (Signed)
[facility] EMERGENCY DEPARTMENT Provider Note   CSN: [id number] Arrival date & time: 06/28/18  1023     History   Chief Complaint Chief Complaint  Patient presents with  . Fever    HPI Nicolas [name] is a 82 y.o. male.   Fever      Pt was seen at 1045. Per EMS and pt, c/o gradual onset and persistence of constant fevers since yesterday. Pt states he has had home fevers to "102." LD tylenol at "0200" this morning. Pt states he is s/p colectomy with colostomy 06/19/2018, [facility] Pt states he has had a cough for the past few days, but states "it's just post-nasal drip." Pt also states he slipped and fell 2 weeks ago, bruising his left flank. Pt states he was evaluated by his PMD at the [place] after this fall and was told "I was OK." Denies new fall. Denies abd pain, no N/V, no blood in stools, no back or neck pain, no headache, no CP/palpitations, no SOB.    Past Medical History:  Diagnosis Date  . Anemia   . Anemia   . Cancer ([place])   . Colon cancer ([place])   . Prostate atrophy   . Syncope and collapse 02/2014    Patient Active Problem List   Diagnosis Date Noted  . Overweight (BMI 25.0-29.9) 07/10/2016  . Influenza with other respiratory manifestations 02/20/2014  . Pneumothorax, traumatic 02/20/2014  . Fracture of multiple ribs 02/20/2014  . Anemia 02/20/2014  . Syncope 02/19/2014  . Hematoma 02/19/2014  . Fever 02/19/2014  . Macrocytic anemia 02/19/2014    Past Surgical History:  Procedure Laterality Date  . CIRCUMCISION    . COLECTOMY    . COLONOSCOPY    . COLOSTOMY          Home Medications    Prior to Admission medications   Medication Sig Start Date End Date Taking? Authorizing Provider  finasteride (PROSCAR) 5 MG tablet Take 5 mg by mouth daily. 02/17/14   [provider]  tamsulosin (FLOMAX) 0.4 MG CAPS capsule Take 0.4 mg by mouth daily. 02/17/14   [provider]    Family History Family History  Problem Relation Age  of Onset  . CAD Mother   . Pancreatic cancer Father   . Stroke Neg Hx     Social History Social History   Tobacco Use  . Smoking status: Former Research scientist (life sciences)  . Smokeless tobacco: Never Used  . Tobacco comment: QUIT SMOKING IN [place] 80"S  Substance Use Topics  . Alcohol use: No  . Drug use: No     Allergies   Influenza vaccines   Review of Systems Review of Systems  Constitutional: Positive for fever.  ROS: Statement: All systems negative except as marked or noted in the HPI; Constitutional: +fever and chills. ; ; Eyes: Negative for eye pain, redness and discharge. ; ; ENMT: Negative for ear pain, hoarseness, nasal congestion, sinus pressure and sore throat. ; ; Cardiovascular: Negative for chest pain, palpitations, diaphoresis, dyspnea and peripheral edema. ; ; Respiratory: +cough. Negative for wheezing and stridor. ; ; Gastrointestinal: Negative for nausea, vomiting, diarrhea, abdominal pain, blood in stool, hematemesis, jaundice and rectal bleeding. . ; ; Genitourinary: Negative for dysuria, flank pain and hematuria. ; ; Musculoskeletal: Negative for back pain and neck pain. Negative for swelling and trauma.; ; Skin: +rash, bruising. Negative for pruritus, abrasions, blisters, and skin lesion.; ; Neuro: Negative for headache, lightheadedness and neck stiffness. Negative for  weakness, altered level of consciousness, altered mental status, extremity weakness, paresthesias, involuntary movement, seizure and syncope.      Physical Exam Updated Vital Signs BP (!) 94/52   Pulse (!) 103   Temp (!) 103 F (39.4 C) (Oral)   Resp (!) 26   Ht 6\' 1"  (1.854 m)   Wt 86.2 kg (190 lb)   SpO2 95%   BMI 25.07 kg/m    Patient Vitals for the past 24 hrs:  BP Temp Temp src Pulse Resp SpO2 Height Weight  06/28/18 1143 (!) 94/52 - - (!) 103 (!) 26 95 % - -  06/28/18 1140 (!) 94/52 (!) 103 F (39.4 C) Oral (!) 105 - 93 % - -  06/28/18 1029 - - - - - - 6\' 1"  (1.854 m) 86.2 kg (190 lb)  06/28/18  1028 (!) 142/62 (!) 101.2 F (38.4 C) Oral (!) 132 (!) 24 95 % - -     Physical Exam 1050: Physical examination:  Nursing notes reviewed; Vital signs and O2 SAT reviewed;  Constitutional: Well developed, Well nourished, In no acute distress; Head:  Normocephalic, atraumatic; Eyes: EOMI, PERRL, No scleral icterus; ENMT: Mouth and pharynx normal, Mucous membranes dry; Neck: Supple, Full range of motion, No lymphadenopathy; Cardiovascular: Tachycardic rate and rhythm, No gallop; Respiratory: Breath sounds coarse & equal bilaterally, No wheezes. +moist cough during exam. Speaking full sentences with ease, Normal respiratory effort/excursion; Chest: Nontender, Movement normal; Abdomen: Soft, Nontender, Nondistended, Normal bowel sounds. +midline surgical wound closed with staples, +surrounding erythema, no drainage, no fluctuance. +colostomy left abd draining brown stool, no gross blood. +red horizontal streak to left anterior abd wall. ; Genitourinary: No CVA tenderness; Spine:  No midline CS, TS, LS tenderness. +fading ecchymosis to left flank, no open wounds.;; Extremities: Peripheral pulses normal, No tenderness, No edema, No calf edema or asymmetry.; Neuro: AA&Ox3, Major CN grossly intact. No facial droop. Speech clear. No gross focal motor or sensory deficits in extremities.; Skin: Color normal, Warm, Dry.   ED Treatments / Results  Labs (all labs ordered are listed, but only abnormal results are displayed)   EKG EKG Interpretation  Date/Time:  Friday June 28 2018 11:01:58 EDT Ventricular Rate:  100 PR Interval:    QRS Duration: 84 QT Interval:  321 QTC Calculation: 414 R Axis:   30 Text Interpretation:  Sinus tachycardia with irregular rate Probable posterior infarct, recent Artifact When compared with ECG of 02/19/2014 Rate faster Confirmed by Francine Graven [phone]) on 06/28/2018 12:08:59 PM   Radiology   Procedures Procedures (including critical care time)  Medications Ordered  in ED Medications  0.9 %  sodium chloride infusion (1,000 mLs Intravenous New Bag/Given 06/28/18 1043)  vancomycin (VANCOCIN) 1,500 mg in sodium chloride 0.9 % 500 mL IVPB (1,500 mg Intravenous New Bag/Given 06/28/18 1149)  sodium chloride 0.9 % bolus 500 mL (has no administration in time range)  piperacillin-tazobactam (ZOSYN) IVPB 3.375 g (3.375 g Intravenous New Bag/Given 06/28/18 1140)  sodium chloride 0.9 % bolus 500 mL (500 mLs Intravenous New Bag/Given 06/28/18 1145)  acetaminophen (TYLENOL) tablet 1,000 mg (1,000 mg Oral Given 06/28/18 1153)  iopamidol (ISOVUE-300) 61 % injection 100 mL (100 mLs Intravenous Contrast Given 06/28/18 1214)     Initial Impression / Assessment and Plan / ED Course  I have reviewed the triage vital signs and the nursing notes.  Pertinent labs & imaging results that were available during my care of the patient were reviewed by me and considered in my medical  decision making (see chart for details).  MDM Reviewed: previous chart, nursing note and vitals Reviewed previous: labs and ECG Interpretation: labs, ECG, x-ray and CT scan Total time providing critical care: 30-74 minutes. This excludes time spent performing separately reportable procedures and services. Consults: admitting MD    CRITICAL CARE Performed by: Alfonzo Feller Total critical care time: 35 minutes Critical care time was exclusive of separately billable procedures and treating other patients. Critical care was necessary to treat or prevent imminent or life-threatening deterioration. Critical care was time spent personally by me on the following activities: development of treatment plan with patient and/or surrogate as well as nursing, discussions with consultants, evaluation of patient's response to treatment, examination of patient, obtaining history from patient or surrogate, ordering and performing treatments and interventions, ordering and review of laboratory studies, ordering and  review of radiographic studies, pulse oximetry and re-evaluation of patient's condition.   Results for orders placed or performed during the hospital encounter of 06/28/18  Culture, blood (Routine x 2)  Result Value Ref Range   Specimen Description BLOOD RIGHT ARM    Special Requests      BOTTLES DRAWN AEROBIC AND ANAEROBIC Blood Culture results may not be optimal due to an inadequate volume of blood received in culture bottles Performed at [facility], [address] [place], [place] [address]    Culture PENDING    Report Status PENDING   Culture, blood (Routine x 2)  Result Value Ref Range   Specimen Description BLOOD LEFT HAND    Special Requests      BOTTLES DRAWN AEROBIC AND ANAEROBIC Blood Culture adequate volume Performed at [facility], [address], [place], [place] [address]    Culture PENDING    Report Status PENDING   Comprehensive metabolic panel  Result Value Ref Range   Sodium 139 135 - 145 mmol/L   Potassium 3.2 (L) 3.5 - 5.1 mmol/L   Chloride 109 98 - 111 mmol/L   CO2 21 (L) 22 - 32 mmol/L   Glucose, Bld 109 (H) 70 - 99 mg/dL   BUN 15 8 - 23 mg/dL   Creatinine, Ser 0.99 0.61 - 1.24 mg/dL   Calcium 7.9 (L) 8.9 - 10.3 mg/dL   Total Protein 6.3 (L) 6.5 - 8.1 g/dL   Albumin 3.0 (L) 3.5 - 5.0 g/dL   AST 22 15 - 41 U/L   ALT 26 0 - 44 U/L   Alkaline Phosphatase 111 38 - 126 U/L   Total Bilirubin 1.5 (H) 0.3 - 1.2 mg/dL   GFR calc non Af Amer >60 >60 mL/min   GFR calc Af Amer >60 >60 mL/min   Anion gap 9 5 - 15  CBC with Differential  Result Value Ref Range   WBC 16.8 (H) 4.0 - 10.5 K/uL   RBC 2.79 (L) 4.22 - 5.81 MIL/uL   Hemoglobin 9.2 (L) 13.0 - 17.0 g/dL   HCT 30.8 (L) 39.0 - 52.0 %   MCV 110.4 (H) 78.0 - 100.0 fL   MCH 33.0 26.0 - 34.0 pg   MCHC 29.9 (L) 30.0 - 36.0 g/dL   RDW 19.7 (H) 11.5 - 15.5 %   Platelets 135 (L) 150 - 400 K/uL   Neutrophils Relative % 80 %   Neutro Abs 13.5 1.7 - 7.7 K/uL   Lymphocytes Relative 3 %   Lymphs Abs 0.5 0.7 -  4.0 K/uL   Monocytes Relative 17 %   Monocytes Absolute 2.8 0.1 - 1.0 K/uL  Eosinophils Relative 0 %   Eosinophils Absolute 0.1 0.0 - 0.7 K/uL   Basophils Relative 0 %   Basophils Absolute 0.0 0.0 - 0.1 K/uL   WBC Morphology ATYPICAL LYMPHOCYTES    RBC Morphology MACROCYTES   Protime-INR  Result Value Ref Range   Prothrombin Time 16.9 (H) 11.4 - 15.2 seconds   INR 1.39   Urinalysis, Routine w reflex microscopic  Result Value Ref Range   Color, Urine YELLOW YELLOW   APPearance CLEAR CLEAR   Specific Gravity, Urine 1.014 1.005 - 1.030   pH 5.0 5.0 - 8.0   Glucose, UA NEGATIVE NEGATIVE mg/dL   Hgb urine dipstick SMALL (A) NEGATIVE   Bilirubin Urine NEGATIVE NEGATIVE   Ketones, ur NEGATIVE NEGATIVE mg/dL   Protein, ur NEGATIVE NEGATIVE mg/dL   Nitrite NEGATIVE NEGATIVE   Leukocytes, UA NEGATIVE NEGATIVE   RBC / HPF 0-5 0 - 5 RBC/hpf   WBC, UA 0-5 0 - 5 WBC/hpf   Bacteria, UA NONE SEEN NONE SEEN   Squamous Epithelial / LPF 0-5 0 - 5   Mucus PRESENT   Lactic acid, plasma  Result Value Ref Range   Lactic Acid, Venous 2.5 (HH) 0.5 - 1.9 mmol/L  Lactic acid, plasma  Result Value Ref Range   Lactic Acid, Venous 3.2 (HH) 0.5 - 1.9 mmol/L  Troponin I  Result Value Ref Range   Troponin I 0.03 (HH) <0.03 ng/mL  Acetaminophen level  Result Value Ref Range   Acetaminophen (Tylenol), Serum <10 (L) 10 - 30 ug/mL  Salicylate level  Result Value Ref Range   Salicylate Lvl <1.0 2.8 - 30.0 mg/dL  Ethanol  Result Value Ref Range   Alcohol, Ethyl (B) <10 <10 mg/dL  Urine rapid drug screen (hosp performed)  Result Value Ref Range   Opiates NONE DETECTED NONE DETECTED   Cocaine NONE DETECTED NONE DETECTED   Benzodiazepines NONE DETECTED NONE DETECTED   Amphetamines NONE DETECTED NONE DETECTED   Tetrahydrocannabinol NONE DETECTED NONE DETECTED   Barbiturates NONE DETECTED NONE DETECTED   Ct Abdomen Pelvis W Contrast Result Date: 06/28/2018 CLINICAL DATA:  82 year old male  postoperative day 9 from colectomy and colostomy for colon cancer performed at the [place]. Fever, abdominal pain, tachycardia. EXAM: CT ABDOMEN AND PELVIS WITH CONTRAST TECHNIQUE: Multidetector CT imaging of the abdomen and pelvis was performed using the standard protocol following bolus administration of intravenous contrast. 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 Resolved small left pleural effusion since that time. Mild cardiomegaly is increased. No pericardial effusion. Calcified coronary artery atherosclerosis is evident. Hepatobiliary: Stable 15 millimeter hypodense area along the anterior right liver dome since 2015, benign. Stable similar small peripheral hypodense areas along the falciform ligament and anterior right lobe (images 25 and 36, respectively). Negative gallbladder. Pancreas: Negative. Spleen: Mild splenomegaly, otherwise negative. Adrenals/Urinary Tract: Small intermediate density right adrenal nodule measuring 10 millimeters is indeterminate and was not definitely present in 2015. The left adrenal appears normal. Mild bilateral nonspecific perinephric stranding. Symmetric renal contrast enhancement and excretion. Decompressed proximal ureters. Diminutive urinary bladder containing non dependent gas (series 2, image 81). No perivesical stranding. Stomach/Bowel: There is what appears to be a thick-walled blind-ending rectum in the pelvis. There is trace adjacent pneumoperitoneum (series 2, image 81) and a small volume of free fluid. The rectal stump is difficult to differentiate from nondilated  small bowel in the pelvis. There is a left side descending colostomy with no adverse features. The upstream large bowel is decompressed. The transverse colon is redundant. Normal appendix. Motion artifact in the lower abdomen.  Decompressed terminal ileum. Decompressed distal small bowel is partially within the small or developing right inguinal hernia as demonstrated on coronal image 30. Similar but less pronounced bulge at the left inguinal ring with adjacent small bowel. The stomach and duodenum are decompressed. The proximal jejunum is mildly dilated with gas and fluid measuring 3 centimeters diameter. There is a gradual transition to decompressed mid and distal small bowel. No abdominal free fluid. Vascular/Lymphatic: Aortoiliac calcified atherosclerosis. The major arterial structures in the abdomen and pelvis are patent. Portal venous system is patent. No lymphadenopathy. Reproductive: Marginal right inguinal hernia which contains a small volume of pneumoperitoneum (series 2, image 81). Other: Small volume pelvic free fluid.  Mild presacral stranding. Postoperative changes to the ventral abdominal wall with stranding. Trace fluid subjacent to the skin staples in the midline. No associated rim enhancement. The Musculoskeletal: Chronic lower posterior left rib fractures. Flowing osteophytes in the spine. Chronic L5-S1 ankylosis. No acute osseous abnormality identified. IMPRESSION: 1. Postoperative changes to the abdomen with descending colostomy and what appears to be a blind ending rectum. Mild rectal wall thickening, trace pneumoperitoneum and free fluid appear within normal limits for the postoperative state. 2. Proximal small bowel ileus suspected. No evidence of obstruction at the colostomy. Small bilateral inguinal hernias do marginally involve the distal small bowel, but do not appear consequential at this time. 3. Postoperative changes to the ventral abdomen likewise appear within normal limits. 4. Small indeterminate 10 mm right adrenal nodule. Small chronic low-density areas in the liver are stable since 2015 and benign. 5. Chronic right lung base fibrothorax. Aortic Atherosclerosis (ICD10-I70.0). Electronically Signed   By:  Genevie Ann M.D.   On: 06/28/2018 13:00   Dg Chest Port 1 View Result Date: 06/28/2018 CLINICAL DATA:  Fever.  History of colon carcinoma EXAM: PORTABLE CHEST 1 VIEW COMPARISON:  February 20, 2014 FINDINGS: There is atelectatic change in the right base. Lungs elsewhere are clear. Heart is borderline prominent with pulmonary vascularity normal. No adenopathy. There is aortic atherosclerosis. There is degenerative change in each shoulder. IMPRESSION: Right base atelectasis. No edema or consolidation. Stable cardiac prominence. There is aortic atherosclerosis. Aortic Atherosclerosis (ICD10-I70.0). Electronically Signed   By: Lowella Grip III M.D.   On: 06/28/2018 11:13     1325:  Pt states he took "more" tylenol PTA, but unclear regarding time. Tylenol level here is negative, so APAP given. Code Sepsis called on pt's arrival; IV abx ordered after BC and UC obtained. Source of fever likely abd wall cellulitis. Abd continues benign on exam, resps easy, NAD.  H/H and platelets per baseline. T/C returned from [facility] Dr. Wynetta Emery, case discussed, including:  HPI, pertinent PM/SHx, VS/PE, dx testing, ED course and treatment:  Agreeable to admit.  1335:  2nd lactic acid resulted and was elevated; IVF NS bolus given for total 70ml/kg. BP 124/47, HR 98, Sats 95% R/A.   1400:  Pt climbed out of bed himself and walked down the hallway to the bathroom because he "had to urinate." Pt's colostomy bag was very full and leaked in bathroom. Pt cleaned and escorted back to his exam room; gait steady, resps easy, NAD.     Final Clinical Impressions(s) / ED Diagnoses   Final diagnoses:  Fever    ED Discharge Orders  None       Francine Graven, [place] 06/30/18 [phone]

## 2018-06-28 NOTE — Care Management (Addendum)
Received call from [place], patient is active with [facility] for nursing and PT.

## 2018-06-28 NOTE — Progress Notes (Signed)
Spoke with Wells Guiles, RN regarding PICC order.  Patient with low BPs at this time and will not be able to place bedside PICC until he is stable. Recommend IJ if central access is needed urgently.  Wells Guiles to update MD.  Will follow up later.  Carolee Rota, RN  [facility]

## 2018-06-28 NOTE — ED Notes (Signed)
CRITICAL VALUE ALERT  Critical Value:  Troponin 0.03  Date & Time Notied: 06/28/18 1153  Provider Notified: Mcmanus  Orders Received/Actions taken: none

## 2018-06-28 NOTE — Accreditation Note (Signed)
CRITICAL VALUE ALERT  Critical Value: 3.6 lactic  Date & Time Notied:  06/28/18  1950 Provider Notified: Kennon Holter  Orders Received/Actions taken: No new orders recieved

## 2018-06-28 NOTE — ED Notes (Signed)
CRITICAL VALUE ALERT  Critical Value:  Lactic Acid 3.2  Date & Time Notied:  06/28/18 1330  Provider Notified: Dr. Thurnell Garbe  Orders Received/Actions taken: EDP and pt's primary RN notified

## 2018-06-28 NOTE — ED Triage Notes (Signed)
Pt brought in by EMS due to fever. Pt has hx of colon cancer and on 06/19/18 he had colectomy with colostomy. Pt reported to EMS that he woke up 0200 fever 102 and took tylenol. Pt has staples intact from umbilicus and down. With slight redness around staples. Brown formed stool in bag. Procedure performed at [facility]

## 2018-06-28 NOTE — Consult Note (Addendum)
[facility] Nurse ostomy follow up Patient currently in CT scan.  I spoke with primary care RN, Amorelle, via phone and gave her the following Kellie Simmering #s for ostomy supplies:  #725 for a one piece pouching system; #s 2 and 649 for a two piece pouching system; and [phone] for barrier rings. Further WOC intervention can occur next week.  Val Riles, RN, MSN, CWOCN, CNS-BC, pager [phone]

## 2018-06-28 NOTE — ED Notes (Signed)
Dr Thurnell Garbe notified of suspected sepsis

## 2018-06-28 NOTE — Progress Notes (Signed)
Pharmacy Antibiotic Note  Nicolas [name] is a 82 y.o. male admitted on 06/28/2018 with sepsis.  Pharmacy has been consulted for Vancomycin dosing.  Plan: Vancomycin 1500mg  loading dose, then 1000mg  IV every 12 hours.  Goal trough 15-20 mcg/mL.  Also on Unasyn 3gm IV q6h F/U cxs and clinical progress Monitor V/S, labs and levels as indicated  Height: 6\' 1"  (185.4 cm) Weight: 190 lb (86.2 kg) IBW/kg (Calculated) : 79.9  Temp (24hrs), Avg:100.9 F (38.3 C), Min:98.5 F (36.9 C), Max:103 F (39.4 C)  Recent Labs  Lab 06/28/18 1045 06/28/18 1052 06/28/18 1242  WBC 16.8*  --   --   CREATININE 0.99  --   --   LATICACIDVEN  --  2.5* 3.2*    Estimated Creatinine Clearance: 67.3 mL/min (by C-G formula based on SCr of 0.99 mg/dL).    Allergies  Allergen Reactions  . Influenza Vaccines Nausea And Vomiting    Nausea and vomiting hours after flu shot two years in a row    Antimicrobials this admission: Vancomycin 7/26 >>  Unasyn 7/26 >>  Zosyn x 1 dose in ED 7/26  Dose adjustments this admission: n/a  Microbiology results: 7/26 BCx: pending 7/26 UCx: pending  MRSA PCR:  Thank you for allowing pharmacy to be a part of this patient's care.  Isac Sarna, BS [place], [place] Clinical Pharmacist Pager [phone] 06/28/2018 3:45 PM

## 2018-06-28 NOTE — ED Provider Notes (Signed)
I saw the patient solely to place central line.  No medical management was done on my part.  Physical Exam  BP (!) 99/49   Pulse (!) 102   Temp 98.5 F (36.9 C)   Resp (!) 28   Ht 6\' 1"  (1.854 m)   Wt 86.2 kg (190 lb)   SpO2 96%   BMI 25.07 kg/m   Physical Exam  Eyes: Conjunctivae and EOM are normal.  Pulmonary/Chest: No respiratory distress.  Abdominal: Soft.  Neurological: He is alert.  Skin: Skin is warm and dry.  Nursing note and vitals reviewed.   ED Course/Procedures     .Central Line Date/Time: 06/28/2018 11:40 PM Performed by: Merrily Pew, MD Authorized by: Merrily Pew, MD   Consent:    Consent obtained:  Emergent situation   Consent given by:  Patient   Risks discussed:  Arterial puncture and incorrect placement   Alternatives discussed:  No treatment Pre-procedure details:    Hand hygiene: Hand hygiene performed prior to insertion     Sterile barrier technique: All elements of maximal sterile technique followed     Skin preparation:  2% chlorhexidine   Skin preparation agent: Skin preparation agent completely dried prior to procedure   Anesthesia (see MAR for exact dosages):    Anesthesia method:  Local infiltration   Local anesthetic:  Lidocaine 1% w/o epi Procedure details:    Location:  L internal jugular   Site selection rationale:  Appeared most prominent on [place] and R IJ had some type of plaque/calcification    Patient position:  Reverse Trendelenburg   Procedural supplies:  Triple lumen   Catheter size:  7.5 Fr   Landmarks identified: no     Ultrasound guidance: yes     Sterile ultrasound techniques: Sterile gel and sterile probe covers were used     Number of attempts:  1   Successful placement: yes   Post-procedure details:    Post-procedure:  Dressing applied and line sutured   Assessment:  Blood return through all ports, no pneumothorax on x-ray, placement verified by x-ray and free fluid flow   Patient tolerance of procedure:  Tolerated  well, no immediate complications       Merrily Pew, MD 06/28/18 2342

## 2018-06-28 NOTE — ED Notes (Signed)
Dr. Wynetta Emery at bedside at this time and made aware of hypotension. No new orders at this time.

## 2018-06-28 NOTE — ED Notes (Signed)
Updated report called to Benjamine Mola, RN in the ICU for room 11.

## 2018-06-29 ENCOUNTER — Inpatient Hospital Stay (HOSPITAL_COMMUNITY): Payer: Non-veteran care

## 2018-06-29 DIAGNOSIS — A419 Sepsis, unspecified organism: Principal | ICD-10-CM

## 2018-06-29 DIAGNOSIS — T8149XA Infection following a procedure, other surgical site, initial encounter: Secondary | ICD-10-CM | POA: Diagnosis present

## 2018-06-29 DIAGNOSIS — R6520 Severe sepsis without septic shock: Secondary | ICD-10-CM

## 2018-06-29 DIAGNOSIS — L03311 Cellulitis of abdominal wall: Secondary | ICD-10-CM

## 2018-06-29 DIAGNOSIS — E872 Acidosis: Secondary | ICD-10-CM

## 2018-06-29 DIAGNOSIS — K9409 Other complications of colostomy: Secondary | ICD-10-CM

## 2018-06-29 LAB — CBC WITH DIFFERENTIAL/PLATELET
BASOS ABS: 0 10*3/uL (ref 0.0–0.1)
Basophils Absolute: 0 10*3/uL (ref 0.0–0.1)
Basophils Relative: 0 %
Basophils Relative: 0 %
EOS ABS: 0 10*3/uL (ref 0.0–0.7)
EOS PCT: 0 %
Eosinophils Absolute: 0 10*3/uL (ref 0.0–0.7)
Eosinophils Relative: 0 %
HCT: 24.6 % — ABNORMAL LOW (ref 39.0–52.0)
HEMATOCRIT: 23.5 % — AB (ref 39.0–52.0)
HEMOGLOBIN: 7.3 g/dL — AB (ref 13.0–17.0)
Hemoglobin: 7.6 g/dL — ABNORMAL LOW (ref 13.0–17.0)
LYMPHS ABS: 1.4 10*3/uL (ref 0.7–4.0)
Lymphocytes Relative: 3 %
Lymphocytes Relative: 3 %
Lymphs Abs: 1.5 10*3/uL (ref 0.7–4.0)
MCH: 34.1 pg — ABNORMAL HIGH (ref 26.0–34.0)
MCH: 33.6 pg (ref 26.0–34.0)
MCHC: 31.1 g/dL (ref 30.0–36.0)
MCHC: 30.9 g/dL (ref 30.0–36.0)
MCV: 109.8 fL — ABNORMAL HIGH (ref 78.0–100.0)
MCV: 108.8 fL — ABNORMAL HIGH (ref 78.0–100.0)
MONOS PCT: 15 %
Monocytes Absolute: 7.4 10*3/uL — ABNORMAL HIGH (ref 0.1–1.0)
Monocytes Absolute: 5.6 10*3/uL — ABNORMAL HIGH (ref 0.1–1.0)
Monocytes Relative: 12 %
NEUTROS ABS: 39.4 10*3/uL — AB (ref 1.7–7.7)
NEUTROS PCT: 82 %
NEUTROS PCT: 85 %
Neutro Abs: 40.2 10*3/uL — ABNORMAL HIGH (ref 1.7–7.7)
PLATELETS: 115 10*3/uL — AB (ref 150–400)
Platelets: 122 10*3/uL — ABNORMAL LOW (ref 150–400)
RBC: 2.14 MIL/uL — AB (ref 4.22–5.81)
RBC: 2.26 MIL/uL — ABNORMAL LOW (ref 4.22–5.81)
RDW: 20.8 % — ABNORMAL HIGH (ref 11.5–15.5)
RDW: 20.6 % — AB (ref 11.5–15.5)
WBC: 49.1 10*3/uL — AB (ref 4.0–10.5)
WBC: 46.4 10*3/uL — ABNORMAL HIGH (ref 4.0–10.5)

## 2018-06-29 LAB — LACTIC ACID, PLASMA
Lactic Acid, Venous: 1.6 mmol/L (ref 0.5–1.9)
Lactic Acid, Venous: 1 mmol/L (ref 0.5–1.9)

## 2018-06-29 LAB — C DIFFICILE QUICK SCREEN W PCR REFLEX
C DIFFICILE (CDIFF) INTERP: NOT DETECTED
C DIFFICLE (CDIFF) ANTIGEN: NEGATIVE
C Diff toxin: NEGATIVE

## 2018-06-29 LAB — COMPREHENSIVE METABOLIC PANEL
ALBUMIN: 2.4 g/dL — AB (ref 3.5–5.0)
ALT: 24 U/L (ref 0–44)
ANION GAP: 6 (ref 5–15)
AST: 33 U/L (ref 15–41)
Alkaline Phosphatase: 78 U/L (ref 38–126)
BILIRUBIN TOTAL: 1.4 mg/dL — AB (ref 0.3–1.2)
BUN: 20 mg/dL (ref 8–23)
CO2: 19 mmol/L — ABNORMAL LOW (ref 22–32)
Calcium: 6.7 mg/dL — ABNORMAL LOW (ref 8.9–10.3)
Chloride: 115 mmol/L — ABNORMAL HIGH (ref 98–111)
Creatinine, Ser: 1.12 mg/dL (ref 0.61–1.24)
GFR calc non Af Amer: >60 mL/min (ref >60)
GLUCOSE: 109 mg/dL — AB (ref 70–99)
POTASSIUM: 4.4 mmol/L (ref 3.5–5.1)
SODIUM: 140 mmol/L (ref 135–145)
TOTAL PROTEIN: 5.4 g/dL — AB (ref 6.5–8.1)

## 2018-06-29 LAB — MAGNESIUM: MAGNESIUM: 1.5 mg/dL — AB (ref 1.7–2.4)

## 2018-06-29 MED ORDER — IOPAMIDOL (ISOVUE-300) INJECTION 61%
100.0000 mL | Freq: Once | INTRAVENOUS | Status: AC | PRN
  Administered 2018-06-29: 100 mL via INTRAVENOUS

## 2018-06-29 MED ORDER — CLINDAMYCIN PHOSPHATE 300 MG/50ML IV SOLN: 300.0000 mg | Freq: Four times a day (QID) | INTRAVENOUS | Status: DC

## 2018-06-29 MED ORDER — PIPERACILLIN-TAZOBACTAM 3.375 G IVPB
3.3750 g | Freq: Three times a day (TID) | INTRAVENOUS | Status: AC
  Administered 2018-06-29 – 2018-07-04 (×17): 3.375 g via INTRAVENOUS
  Filled 2018-06-29 (×17): qty 50

## 2018-06-29 MED ORDER — CLINDAMYCIN PHOSPHATE 900 MG/50ML IV SOLN
900.0000 mg | Freq: Three times a day (TID) | INTRAVENOUS | Status: DC
  Administered 2018-06-29 – 2018-06-30 (×3): 900 mg via INTRAVENOUS
  Filled 2018-06-29 (×3): qty 50

## 2018-06-29 MED ORDER — IOPAMIDOL (ISOVUE-300) INJECTION 61%
30.0000 mL | Freq: Once | INTRAVENOUS | Status: AC | PRN
  Administered 2018-06-29: 30 mL via ORAL

## 2018-06-29 MED ORDER — VANCOMYCIN HCL IN DEXTROSE 1-5 GM/200ML-% IV SOLN
1000.0000 mg | Freq: Two times a day (BID) | INTRAVENOUS | Status: AC
  Administered 2018-06-29 – 2018-07-04 (×11): 1000 mg via INTRAVENOUS
  Filled 2018-06-29 (×10): qty 200

## 2018-06-29 MED ORDER — SODIUM CHLORIDE 0.9 % IV BOLUS
1000.0000 mL | Freq: Once | INTRAVENOUS | Status: AC
  Administered 2018-06-29: 1000 mL via INTRAVENOUS

## 2018-06-29 MED ORDER — MAGNESIUM SULFATE 4 GM/100ML IV SOLN
4.0000 g | Freq: Once | INTRAVENOUS | Status: AC
  Administered 2018-06-29: 4 g via INTRAVENOUS
  Filled 2018-06-29: qty 100

## 2018-06-29 NOTE — Consult Note (Signed)
Nicolas [name]  Reason for [name]: Sepsis s/p Partial colectomy with end colostomy  Referring Physician: Dr. Wynetta Emery   Chief Complaint    Fever      Nicolas [name] is a 82 y.o. male.  HPI: Nicolas [name] is an 82 yo who is POD 10 s/p open partial colectomy with end colostomy for colon cancer. He reports he had this at the [facility] in [place] and that the cancer was found about 2 years. He reports having A JP drain prior to his discharge and having his ostomy bleeding and requiring a suture prior to discharge. He only came home from the hospital about 2 days prior to presenting with fevers and chills. He did not have any abdominal pain or nausea/vomiting. He says he was suppose to get his ostomy changed on Friday by the home health RN but was instead at the hospital.  He says that he works daily at [employer] and [employer].  He is active and has been relatively healthy. He has not felt bad but has been having this fever.   He did have a fall and bruised his left flank but has not been hurting.   Past Medical History:  Diagnosis Date  . Anemia   . Anemia   . Cancer ([place])   . Colon cancer ([place])   . Prostate atrophy   . Syncope and collapse 02/2014    Past Surgical History:  Procedure Laterality Date  . CIRCUMCISION    . COLECTOMY    . COLONOSCOPY    . COLOSTOMY      Family History  Problem Relation Age of Onset  . CAD Mother   . Pancreatic cancer Father   . Stroke Neg Hx     Social History   Tobacco Use  . Smoking status: Former Smoker    Last attempt to quit: 06/29/1979    Years since quitting: 39.0  . Smokeless tobacco: Never Used  . Tobacco comment: QUIT SMOKING IN [place] 82"S  Substance Use Topics  . Alcohol use: No  . Drug use: No    Medications:  I have reviewed the patient's current medications. Prior to Admission:  Medications Prior to Admission  Medication Sig Dispense Refill Last Dose  . finasteride (PROSCAR) 5 MG tablet Take 5 mg by mouth  daily.   06/28/2018 at Unknown time  . tamsulosin (FLOMAX) 0.4 MG CAPS capsule Take 0.4 mg by mouth daily.   06/28/2018 at Unknown time   Scheduled: . Chlorhexidine Gluconate Cloth  6 each Topical Daily  . docusate sodium  100 mg Oral BID  . enoxaparin (LOVENOX) injection  40 mg Subcutaneous Q24H  . finasteride  5 mg Oral Daily  . sodium chloride flush  10-40 mL Intracatheter Q12H  . tamsulosin  0.4 mg Oral Daily   Continuous: . sodium chloride 150 mL/hr at 06/29/18 1849  . clindamycin (CLEOCIN) IV 900 mg (06/29/18 2105)  . norepinephrine (LEVOPHED) Adult infusion Stopped (06/29/18 1848)  . piperacillin-tazobactam (ZOSYN)  IV 3.375 g (06/29/18 2105)  . vancomycin Stopped (06/29/18 1345)   QMV:HQIONGEXBMWUX **OR** acetaminophen, ondansetron **OR** ondansetron (ZOFRAN) IV, sodium chloride flush, traZODone  Allergies  Allergen Reactions  . Influenza Vaccines Nausea And Vomiting    Nausea and vomiting hours after flu shot two years in a row     ROS:  A comprehensive review of systems was negative except for: Constitutional: positive for fevers Respiratory: positive for cough Gastrointestinal: positive for decreased ostomy output  Blood pressure  135/67, pulse 69, temperature 98.1 F (36.7 C), temperature source Oral, resp. rate (!) 21, height _0  (1.803 m), weight 199 lb 4.7 oz (90.4 kg), SpO2 94 %. Physical Exam  Constitutional: He is oriented to person, place, and time. He appears well-developed and well-nourished.  HENT:  Head: Normocephalic and atraumatic.  Eyes: Pupils are equal, round, and reactive to light. EOM are normal.  Neck: Normal range of motion. Neck supple.  Abdominal: Soft. He exhibits no distension. There is tenderness.  Tender in midline lower, midline inferiorly with some erythema, staples removed and incision opened up with some brownish fluid draining, fascia probed and intact, packed with gauze; ostomy wafer removed, ostomy with superficial mucosa necrosis,  glass lab tube used to investigate the inside and was pale pink, digitized the ostomy easily, no pain or tenderness, lateral edge of the ostomy with some superficial skin sloughing, red, not extending outward past the area where skin has peeled off, wafer replaced   Musculoskeletal: Normal range of motion.  Neurological: He is alert and oriented to person, place, and time.  Skin: Skin is warm and dry.  Psychiatric: He has a normal mood and affect. His behavior is normal. Judgment and thought content normal.  Vitals reviewed.  At about 930AM.     Results: Results for orders placed or performed during the hospital encounter of 06/28/18 (from the past 48 hour(s))  Comprehensive metabolic panel     Status: Abnormal   Collection Time: 06/28/18 10:45 AM  Result Value Ref Range   Sodium 139 135 - 145 mmol/L   Potassium 3.2 (L) 3.5 - 5.1 mmol/L   Chloride 109 98 - 111 mmol/L   CO2 21 (L) 22 - 32 mmol/L   Glucose, Bld 109 (H) 70 - 99 mg/dL   BUN 15 8 - 23 mg/dL   Creatinine, Ser 0.99 0.61 - 1.24 mg/dL   Calcium 7.9 (L) 8.9 - 10.3 mg/dL   Total Protein 6.3 (L) 6.5 - 8.1 g/dL   Albumin 3.0 (L) 3.5 - 5.0 g/dL   AST 22 15 - 41 U/L   ALT 26 0 - 44 U/L   Alkaline Phosphatase 111 38 - 126 U/L   Total Bilirubin 1.5 (H) 0.3 - 1.2 mg/dL   GFR calc non Af Amer >60 >60 mL/min   GFR calc Af Amer >60 >60 mL/min    Comment: (NOTE) The eGFR has been calculated using the CKD EPI equation. This calculation has not been validated in all clinical situations. eGFR's persistently <60 mL/min signify possible Chronic Kidney Disease.    Anion gap 9 5 - 15    Comment: Performed at [facility], [address], [place], [place] [address]  CBC with Differential     Status: Abnormal   Collection Time: 06/28/18 10:45 AM  Result Value Ref Range   WBC 16.8 (H) 4.0 - 10.5 K/uL   RBC 2.79 (L) 4.22 - 5.81 MIL/uL   Hemoglobin 9.2 (L) 13.0 - 17.0 g/dL   HCT 30.8 (L) 39.0 - 52.0 %   MCV 110.4 (H) 78.0 - 100.0 fL    MCH 33.0 26.0 - 34.0 pg   MCHC 29.9 (L) 30.0 - 36.0 g/dL   RDW 19.7 (H) 11.5 - 15.5 %   Platelets 135 (L) 150 - 400 K/uL   Neutrophils Relative % 80 %   Neutro Abs 13.5 1.7 - 7.7 K/uL   Lymphocytes Relative 3 %   Lymphs Abs 0.5 0.7 - 4.0 K/uL   Monocytes Relative  17 %   Monocytes Absolute 2.8 0.1 - 1.0 K/uL   Eosinophils Relative 0 %   Eosinophils Absolute 0.1 0.0 - 0.7 K/uL   Basophils Relative 0 %   Basophils Absolute 0.0 0.0 - 0.1 K/uL   WBC Morphology ATYPICAL LYMPHOCYTES     Comment: MODERATE LEFT SHIFT (>5% METAS AND MYELOS,OCC PRO NOTED)   RBC Morphology MACROCYTES     Comment: Performed at [facility], [address], [place], [place] [address]  Protime-INR     Status: Abnormal   Collection Time: 06/28/18 10:45 AM  Result Value Ref Range   Prothrombin Time 16.9 (H) 11.4 - 15.2 seconds   INR 1.39     Comment: Performed at [facility], [address], [place], [place] [address]  Urinalysis, Routine w reflex microscopic     Status: Abnormal   Collection Time: 06/28/18 10:45 AM  Result Value Ref Range   Color, Urine YELLOW YELLOW   APPearance CLEAR CLEAR   Specific Gravity, Urine 1.014 1.005 - 1.030   pH 5.0 5.0 - 8.0   Glucose, UA NEGATIVE NEGATIVE mg/dL   Hgb urine dipstick SMALL (A) NEGATIVE   Bilirubin Urine NEGATIVE NEGATIVE   Ketones, ur NEGATIVE NEGATIVE mg/dL   Protein, ur NEGATIVE NEGATIVE mg/dL   Nitrite NEGATIVE NEGATIVE   Leukocytes, UA NEGATIVE NEGATIVE   RBC / HPF 0-5 0 - 5 RBC/hpf   WBC, UA 0-5 0 - 5 WBC/hpf   Bacteria, UA NONE SEEN NONE SEEN   Squamous Epithelial / LPF 0-5 0 - 5   Mucus PRESENT     Comment: Performed at [facility], [address] [place], [place] [address]  Culture, blood (Routine x 2)     Status: None (Preliminary result)   Collection Time: 06/28/18 10:51 AM  Result Value Ref Range   Specimen Description BLOOD RIGHT ARM    Special Requests      BOTTLES DRAWN AEROBIC AND ANAEROBIC Blood Culture results may not be optimal due to  an inadequate volume of blood received in culture bottles   Culture      NO GROWTH < 24 HOURS Performed at [facility], [address], [place], [place] [address]    Report Status PENDING   Lactic acid, plasma     Status: Abnormal   Collection Time: 06/28/18 10:52 AM  Result Value Ref Range   Lactic Acid, Venous 2.5 (HH) 0.5 - 1.9 mmol/L    Comment: CRITICAL RESULT CALLED TO, READ BACK BY AND VERIFIED WITH: EVRETT @ 1144 ON [id number] BY HENDERSON L Performed at [facility], [address], [place], [place] [address]   Urine rapid drug screen (hosp performed)     Status: None   Collection Time: 06/28/18 10:53 AM  Result Value Ref Range   Opiates NONE DETECTED NONE DETECTED   Cocaine NONE DETECTED NONE DETECTED   Benzodiazepines NONE DETECTED NONE DETECTED   Amphetamines NONE DETECTED NONE DETECTED   Tetrahydrocannabinol NONE DETECTED NONE DETECTED   Barbiturates NONE DETECTED NONE DETECTED    Comment: (NOTE) DRUG SCREEN FOR MEDICAL PURPOSES ONLY.  IF CONFIRMATION IS NEEDED FOR ANY PURPOSE, NOTIFY LAB WITHIN 5 DAYS. LOWEST DETECTABLE LIMITS FOR URINE DRUG SCREEN Drug Class                     Cutoff (ng/mL) Amphetamine and metabolites    1000 Barbiturate and metabolites    200 Benzodiazepine  856 Tricyclics and metabolites     300 Opiates and metabolites        300 Cocaine and metabolites        300 THC                            50 Performed at [facility], [address], [place], [place] [address]   Culture, blood (Routine x 2)     Status: None (Preliminary result)   Collection Time: 06/28/18 10:55 AM  Result Value Ref Range   Specimen Description BLOOD LEFT HAND    Special Requests      BOTTLES DRAWN AEROBIC AND ANAEROBIC Blood Culture adequate volume   Culture      NO GROWTH < 24 HOURS Performed at [facility], [address] [place], [place] [address]    Report Status PENDING   Troponin I     Status: Abnormal   Collection Time: 06/28/18 11:05 AM    Result Value Ref Range   Troponin I 0.03 (HH) <0.03 ng/mL    Comment: CRITICAL RESULT CALLED TO, READ BACK BY AND VERIFIED WITH: GENTRY R. @ 1153 ON [id number] BY HENDERSON L. Performed at [facility], [address], [place], [place] [address]   Acetaminophen level     Status: Abnormal   Collection Time: 06/28/18 11:06 AM  Result Value Ref Range   Acetaminophen (Tylenol), Serum <10 (L) 10 - 30 ug/mL    Comment: (NOTE) Therapeutic concentrations vary significantly. A range of 10-30 ug/mL  may be an effective concentration for many patients. However, some  are best treated at concentrations outside of this range. Acetaminophen concentrations >150 ug/mL at 4 hours after ingestion  and >50 ug/mL at 12 hours after ingestion are often associated with  toxic reactions. Performed at [facility], [address] [place], [place] [address]   Salicylate level     Status: None   Collection Time: 06/28/18 11:06 AM  Result Value Ref Range   Salicylate Lvl <7.2 2.8 - 30.0 mg/dL    Comment: Performed at [facility], [address] [place], [place] [address]  Ethanol     Status: None   Collection Time: 06/28/18 11:06 AM  Result Value Ref Range   Alcohol, Ethyl (B) <10 <10 mg/dL    Comment: (NOTE) Lowest detectable limit for serum alcohol is 10 mg/dL. For medical purposes only. Performed at [facility], [address] [place], [place] [address]   Lactic acid, plasma     Status: Abnormal   Collection Time: 06/28/18 12:42 PM  Result Value Ref Range   Lactic Acid, Venous 3.2 (HH) 0.5 - 1.9 mmol/L    Comment: CRITICAL RESULT CALLED TO, READ BACK BY AND VERIFIED WITH: WHITE M @ 1328 ON [id number] BY HENDERSON L. Performed at [facility], [address] [place], [place] [address]   MRSA PCR Screening     Status: None   Collection Time: 06/28/18  4:45 PM  Result Value Ref Range   MRSA by PCR NEGATIVE NEGATIVE    Comment:        The GeneXpert MRSA Assay (FDA approved for NASAL  specimens only), is one component of a comprehensive MRSA colonization surveillance program. It is not intended to diagnose MRSA infection nor to guide or monitor treatment for MRSA infections. Performed at [facility], [address] [place], [place] [address]   Lactic acid, plasma     Status: Abnormal   Collection  Time: 06/28/18  6:43 PM  Result Value Ref Range   Lactic Acid, Venous 3.6 (HH) 0.5 - 1.9 mmol/L    Comment: CRITICAL RESULT CALLED TO, READ BACK BY AND VERIFIED WITH: SMITH,J ON 06/28/18 AT 1950 BY LOY,C Performed at [facility], [address] [place], [place] [address]   Comprehensive metabolic panel     Status: Abnormal   Collection Time: 06/29/18  5:29 AM  Result Value Ref Range   Sodium 140 135 - 145 mmol/L   Potassium 4.4 3.5 - 5.1 mmol/L    Comment: DELTA CHECK NOTED   Chloride 115 (H) 98 - 111 mmol/L   CO2 19 (L) 22 - 32 mmol/L   Glucose, Bld 109 (H) 70 - 99 mg/dL   BUN 20 8 - 23 mg/dL   Creatinine, Ser 1.12 0.61 - 1.24 mg/dL   Calcium 6.7 (L) 8.9 - 10.3 mg/dL   Total Protein 5.4 (L) 6.5 - 8.1 g/dL   Albumin 2.4 (L) 3.5 - 5.0 g/dL   AST 33 15 - 41 U/L   ALT 24 0 - 44 U/L   Alkaline Phosphatase 78 38 - 126 U/L   Total Bilirubin 1.4 (H) 0.3 - 1.2 mg/dL   GFR calc non Af Amer >60 >60 mL/min   GFR calc Af Amer >60 >60 mL/min    Comment: (NOTE) The eGFR has been calculated using the CKD EPI equation. This calculation has not been validated in all clinical situations. eGFR's persistently <60 mL/min signify possible Chronic Kidney Disease.    Anion gap 6 5 - 15    Comment: Performed at [facility], [address] [place], [place] [address]  CBC WITH DIFFERENTIAL     Status: Abnormal   Collection Time: 06/29/18  5:29 AM  Result Value Ref Range   WBC 49.1 (H) 4.0 - 10.5 K/uL   RBC 2.14 (L) 4.22 - 5.81 MIL/uL   Hemoglobin 7.3 (L) 13.0 - 17.0 g/dL    Comment: DELTA CHECK NOTED   HCT 23.5 (L) 39.0 - 52.0 %   MCV 109.8 (H) 78.0 - 100.0 fL   MCH 34.1 (H)  26.0 - 34.0 pg   MCHC 31.1 30.0 - 36.0 g/dL   RDW 20.8 (H) 11.5 - 15.5 %   Platelets 122 (L) 150 - 400 K/uL   Neutrophils Relative % 82 %   Lymphocytes Relative 3 %   Monocytes Relative 15 %   Eosinophils Relative 0 %   Basophils Relative 0 %   Neutro Abs 40.2 (H) 1.7 - 7.7 K/uL   Lymphs Abs 1.5 0.7 - 4.0 K/uL   Monocytes Absolute 7.4 (H) 0.1 - 1.0 K/uL   Eosinophils Absolute 0.0 0.0 - 0.7 K/uL   Basophils Absolute 0.0 0.0 - 0.1 K/uL   WBC Morphology DOHLE BODIES     Comment: WHITE COUNT CONFIRMED ON SMEAR MODERATE LEFT SHIFT (>5% METAS AND MYELOS,OCC PRO NOTED) Performed at [facility], [address] [place], [place] [address]   Magnesium     Status: Abnormal   Collection Time: 06/29/18  5:29 AM  Result Value Ref Range   Magnesium 1.5 (L) 1.7 - 2.4 mg/dL    Comment: Performed at [facility], [address], [place], [place] [address]  Lactic acid, plasma     Status: None   Collection Time: 06/29/18  7:40 AM  Result Value Ref Range   Lactic Acid, Venous 1.6 0.5 - 1.9 mmol/L    Comment: Performed at [facility], [place]  [address] [place], [place] [address]  C difficile quick scan w PCR reflex     Status: None   Collection Time: 06/29/18  8:48 AM  Result Value Ref Range   C Diff antigen NEGATIVE NEGATIVE   C Diff toxin NEGATIVE NEGATIVE   C Diff interpretation No C. difficile detected.     Comment: Performed at [facility], [address], [place], [place] [address]  CBC with Differential/Platelet     Status: Abnormal   Collection Time: 06/29/18  9:53 AM  Result Value Ref Range   WBC 46.4 (H) 4.0 - 10.5 K/uL    Comment: WHITE COUNT CONFIRMED ON SMEAR   RBC 2.26 (L) 4.22 - 5.81 MIL/uL   Hemoglobin 7.6 (L) 13.0 - 17.0 g/dL   HCT 24.6 (L) 39.0 - 52.0 %   MCV 108.8 (H) 78.0 - 100.0 fL   MCH 33.6 26.0 - 34.0 pg   MCHC 30.9 30.0 - 36.0 g/dL   RDW 20.6 (H) 11.5 - 15.5 %   Platelets 115 (L) 150 - 400 K/uL    Comment: SPECIMEN CHECKED FOR CLOTS PLATELET COUNT CONFIRMED BY  SMEAR    Neutrophils Relative % 85 %   Lymphocytes Relative 3 %   Monocytes Relative 12 %   Eosinophils Relative 0 %   Basophils Relative 0 %   Neutro Abs 39.4 (H) 1.7 - 7.7 K/uL   Lymphs Abs 1.4 0.7 - 4.0 K/uL   Monocytes Absolute 5.6 (H) 0.1 - 1.0 K/uL   Eosinophils Absolute 0.0 0.0 - 0.7 K/uL   Basophils Absolute 0.0 0.0 - 0.1 K/uL   WBC Morphology DOHLE BODIES     Comment: MODERATE LEFT SHIFT (>5% METAS AND MYELOS,OCC PRO NOTED) Performed at [facility], [address], [place], [place] [address]    Personally reviewed CT- rectal stump with some minimal fluid/ dot of air, looks post op, do not think would create WBC to 49, some stranding in midline and around ostomy, minimal Ct Abdomen Pelvis W Contrast  Result Date: 06/28/2018 CLINICAL DATA:  82 year old male postoperative day 9 from colectomy and colostomy for colon cancer performed at the [place]. Fever, abdominal pain, tachycardia. EXAM: CT ABDOMEN AND PELVIS WITH CONTRAST TECHNIQUE: Multidetector CT imaging of the abdomen and pelvis was performed using the standard protocol following bolus administration of intravenous contrast. 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 Resolved small left pleural effusion since that time. Mild cardiomegaly is increased. No pericardial effusion. Calcified coronary artery atherosclerosis is evident. Hepatobiliary: Stable 15 millimeter hypodense area along the anterior right liver dome since 2015, benign. Stable similar small peripheral hypodense areas along the falciform ligament and anterior right lobe (images 25 and 36, respectively). Negative gallbladder. Pancreas: Negative. Spleen: Mild splenomegaly, otherwise negative. Adrenals/Urinary Tract: Small intermediate density right adrenal nodule measuring 10 millimeters is  indeterminate and was not definitely present in 2015. The left adrenal appears normal. Mild bilateral nonspecific perinephric stranding. Symmetric renal contrast enhancement and excretion. Decompressed proximal ureters. Diminutive urinary bladder containing non dependent gas (series 2, image 81). No perivesical stranding. Stomach/Bowel: There is what appears to be a thick-walled blind-ending rectum in the pelvis. There is trace adjacent pneumoperitoneum (series 2, image 81) and a small volume of free fluid. The rectal stump is difficult to differentiate from nondilated small bowel in the pelvis. There is a left side descending colostomy  with no adverse features. The upstream large bowel is decompressed. The transverse colon is redundant. Normal appendix. Motion artifact in the lower abdomen. Decompressed terminal ileum. Decompressed distal small bowel is partially within the small or developing right inguinal hernia as demonstrated on coronal image 30. Similar but less pronounced bulge at the left inguinal ring with adjacent small bowel. The stomach and duodenum are decompressed. The proximal jejunum is mildly dilated with gas and fluid measuring 3 centimeters diameter. There is a gradual transition to decompressed mid and distal small bowel. No abdominal free fluid. Vascular/Lymphatic: Aortoiliac calcified atherosclerosis. The major arterial structures in the abdomen and pelvis are patent. Portal venous system is patent. No lymphadenopathy. Reproductive: Marginal right inguinal hernia which contains a small volume of pneumoperitoneum (series 2, image 81). Other: Small volume pelvic free fluid.  Mild presacral stranding. Postoperative changes to the ventral abdominal wall with stranding. Trace fluid subjacent to the skin staples in the midline. No associated rim enhancement. The Musculoskeletal: Chronic lower posterior left rib fractures. Flowing osteophytes in the spine. Chronic L5-S1 ankylosis. No acute osseous  abnormality identified. IMPRESSION: 1. Postoperative changes to the abdomen with descending colostomy and what appears to be a blind ending rectum. Mild rectal wall thickening, trace pneumoperitoneum and free fluid appear within normal limits for the postoperative state. 2. Proximal small bowel ileus suspected. No evidence of obstruction at the colostomy. Small bilateral inguinal hernias do marginally involve the distal small bowel, but do not appear consequential at this time. 3. Postoperative changes to the ventral abdomen likewise appear within normal limits. 4. Small indeterminate 10 mm right adrenal nodule. Small chronic low-density areas in the liver are stable since 2015 and benign. 5. Chronic right lung base fibrothorax. Aortic Atherosclerosis (ICD10-I70.0). Electronically Signed   By: Genevie Ann M.D.   On: 06/28/2018 13:00   Portable Chest 1 View  Result Date: 06/29/2018 CLINICAL DATA:  History of sepsis EXAM: PORTABLE CHEST 1 VIEW COMPARISON:  06/28/2017 FINDINGS: Left jugular central line is again noted in the mid superior vena cava. Cardiac shadow remains mildly enlarged. Elevation the right hemidiaphragm is seen. Mild right basilar atelectasis is noted. No effusion or pneumothorax is seen. IMPRESSION: Mild right basilar atelectasis. Electronically Signed   By: Inez Catalina M.D.   On: 06/29/2018 08:29   Dg Chest Portable 1 View  Result Date: 06/28/2018 CLINICAL DATA:  Central line placement. EXAM: PORTABLE CHEST 1 VIEW COMPARISON:  06/28/2018 and prior chest radiographs FINDINGS: A LEFT IJ central venous catheter is noted with tip overlying the mid SVC. Cardiomegaly and mild pulmonary vascular congestion noted. Mild RIGHT basilar atelectasis noted. No pleural effusion or pneumothorax. No significant change otherwise. IMPRESSION: LEFT IJ central venous catheter with tip overlying the mid SVC. No pneumothorax. Cardiomegaly, mild pulmonary vascular congestion and RIGHT basilar atelectasis.  Electronically Signed   By: Margarette Canada M.D.   On: 06/28/2018 16:15   Dg Chest Port 1 View  Result Date: 06/28/2018 CLINICAL DATA:  Fever.  History of colon carcinoma EXAM: PORTABLE CHEST 1 VIEW COMPARISON:  February 20, 2014 FINDINGS: There is atelectatic change in the right base. Lungs elsewhere are clear. Heart is borderline prominent with pulmonary vascularity normal. No adenopathy. There is aortic atherosclerosis. There is degenerative change in each shoulder. IMPRESSION: Right base atelectasis. No edema or consolidation. Stable cardiac prominence. There is aortic atherosclerosis. Aortic Atherosclerosis (ICD10-I70.0). Electronically Signed   By: Lowella Grip III M.D.   On: 06/28/2018 11:13     Assessment & Plan:  ZALMAN HULL is a 82 y.o. male with Septic shock on levophed at 6, WBC to 41, post op day 10 from partial colectomy and end colostomy. Was hypotensive yesterday and now ostomy has some superficial necrosis.  Unsure if this is driving WBC or if hypotension caused. C dif sent early this AM and negative. Wound opened up and packing placed. No large collection or signs of necrotizing infection. Around ostomy there was some redness and induration, minor tenderness, ostomy with superficial mucosal necrosis and inner portion with some pale pink mucosa.  -Given lack of identifiable source, agree with repeat ing CT a/p with oral contrast, worried about deep space collection, pelvic sepsis, necrotizing infection, fungal infection -Discussed with Dr. Wynetta Emery   All questions were answered to the satisfaction of the patient.Virl Cagey 06/29/2018, 11:50 AM

## 2018-06-29 NOTE — Progress Notes (Signed)
[facility]  Came back to look at ostomy and make sure no necrotizing infection suspected. CT reviewed and some gas around the ostomy but superficial and above the fascia.   Inspected ostomy again with wafer off. The lateral edge that had been red where the epidermis was pulled off was a little darker, no spreading of redness.  Ostomy still with about 2/3 superficial necrosis with green layer and deeper with pink ostomy on the inside.    11 blade used on the lateral aspect and probed around the area. Minimal drain and no purulence. No dishwater drainage noted. Not super tender around the ostomy and palpated deeply.    Likely had some ischemia and now superficial necrosis of the mucosa with hypotension to 16X systolic and pressure use.  Will still monitor the wound closely.  I think the gas tracking from above as the ostomy has pulled away from the sutures and I Have probed it and it probes easily. IT also has bilious output in the bag which is encouraging.  Off the levophed now.  Resting and alert and oriented.  BP (!) 102/55   Pulse 74   Temp 97.6 F (36.4 C) (Axillary)   Resp (!) 23   Ht 5\' 11"  (1.803 m)   Wt 199 lb 4.7 oz (90.4 kg)   SpO2 95%   BMI 27.80 kg/m       Wife and patient in room:  Discussed risk of needing to do take back surgery if ostomy continues to show signs of dying.  Discussed that he could have a necrotizing infection but that it does not look that way at this time. Showed wife the ostomy and wounds.  Midline look cleaner and gauze changed.   Discussed that if patient has increasing hypotension and BP drops and requiring more pressors and increasing pressors (more than the levophed 6 that he had been on). Or if showing signs of worsening shock, we may have to go back to the OR to explore and revise/ resite the ostomy.  At this time, inside looking more pink and no significant drainage around the ostomy or tenderness out of portion to exam.    Wafer replaced. Will look tomorrow.  Dr. Wynetta Emery at bedside while I was examining.   Curlene Labrum, MD [facility] [address] [place], [place] [address] [phone] (office)

## 2018-06-29 NOTE — Progress Notes (Addendum)
PROGRESS NOTE  Nicolas [name]  XGZ:[id number]  DOB: Jun 27, 1936  DOA: 06/28/2018 PCP: Sharion Balloon, FNP  Brief Admission Hx: Nicolas [name] is a 82 y.o. male with colon cancer who is recently s/p colectomy with colostomy on 06/21/18 done at the [place] who presented to ED by EMS with complaints of fever up to 102 at home.  He was admitted with severe sepsis.   MDM/Assessment & Plan:  1. Severe Sepsis - suspected from cellulitis near wound closure sites. CT abdomen did not show abscess. With acute rise in WBC, will get a repeat CT abdomen STAT.  Lactate is trending down now.   Follow blood cultures, bolusing IV fluids, IV antibiotics Unasyn/Vanc ordered.  DC vanc as MRSA screen negative. Urine culture pending.   2. Leukocytosis - acute rise in WBC is concerning, repeat CBC / diff STAT to confirm, STAT CT abdomen with contrast ordered, hopefully he is not developing an abscess.  3. Hypotension - Pt has a central line and is on IV levophed to keep MAP>65.  4. Acute blood loss Anemia - Hemodilution with IV fluids,  post op given recent colectomy, follow closely.  5. BPH - resume home medications, follow I/Os.  Bladder scan ordered to be sure he is emptying bladder.   6. Fever - tylenol ordered as needed.  7. Hypokalemia / Hypomagnesemia - IV replacement ordered.  Follow.   8. Thrombocytopenia - ?reactive, follow CBC.  Holding stable. 9. Lactic acidosis - Resolved.  Treating with IV fluid boluses per sepsis protocol and IV fluids.   DVT Prophylaxis: lovenox  Code Status: Full   Family Communication: patient updated at bedside  Disposition Plan: Stepdown ICU   Antimicrobials:  unasyn 7/26  Vancomycin 7/26   Subjective: Pt seen in ICU.  Pt says that he hasn't slept well but no other complaints.    Objective: Vitals:   06/29/18 0530 06/29/18 0545 06/29/18 0600 06/29/18 0615  BP: (!) 96/50 (!) 88/50 (!) 92/51 101/65  Pulse: 78 73 77 74  Resp: (!) 23 (!) 24 (!) 23 (!) 22  Temp:        TempSrc:      SpO2: 96% 95% 95% 94%  Weight:      Height:        Intake/Output Summary (Last 24 hours) at 06/29/2018 4210 Last data filed at 06/29/2018 0617 Gross per 24 hour  Intake 6737.21 ml  Output 600 ml  Net 6137.21 ml   Filed Weights   06/28/18 1029 06/28/18 1649 06/29/18 0415  Weight: 86.2 kg (190 lb) 87.9 kg (193 lb 12.6 oz) 90.4 kg (199 lb 4.7 oz)   REVIEW OF SYSTEMS  As per history otherwise all reviewed and reported negative  Exam: Performed Full Body Exam Today General exam: elderly confused male, lying in bed, NAD.  Respiratory system:  No increased work of breathing. Cardiovascular system: normal S1 & S2 heard.  Gastrointestinal system: Abdomen is nondistended, soft and nontender. Normal bowel sounds heard. Ostomy with liquid dark brown stool seen.  Central nervous system: No focal neurological deficits. Extremities: no CCE. Skin: no severe skin breakdown, has sacral protection pad in place.    Data Reviewed: Basic Metabolic Panel: Recent Labs  Lab 06/28/18 1045  NA 139  K 3.2*  CL 109  CO2 21*  GLUCOSE 109*  BUN 15  CREATININE 0.99  CALCIUM 7.9*   Liver Function Tests: Recent Labs  Lab 06/28/18 1045  AST 22  ALT 26  ALKPHOS 111  BILITOT 1.5*  PROT 6.3*  ALBUMIN 3.0*   No results for input(s): LIPASE, AMYLASE in the last 168 hours. No results for input(s): AMMONIA in the last 168 hours. CBC: Recent Labs  Lab 06/28/18 1045  WBC 16.8*  NEUTROABS 13.5  HGB 9.2*  HCT 30.8*  MCV 110.4*  PLT 135*   Cardiac Enzymes: Recent Labs  Lab 06/28/18 1105  TROPONINI 0.03*   CBG (last 3)  No results for input(s): GLUCAP in the last 72 hours. Recent Results (from the past 240 hour(s))  Culture, blood (Routine x 2)     Status: None (Preliminary result)   Collection Time: 06/28/18 10:51 AM  Result Value Ref Range Status   Specimen Description BLOOD RIGHT ARM  Final   Special Requests   Final    BOTTLES DRAWN AEROBIC AND ANAEROBIC Blood  Culture results may not be optimal due to an inadequate volume of blood received in culture bottles   Culture   Final    NO GROWTH < 24 HOURS Performed at [facility], [address], [place], [place] [address]    Report Status PENDING  Incomplete  Culture, blood (Routine x 2)     Status: None (Preliminary result)   Collection Time: 06/28/18 10:55 AM  Result Value Ref Range Status   Specimen Description BLOOD LEFT HAND  Final   Special Requests   Final    BOTTLES DRAWN AEROBIC AND ANAEROBIC Blood Culture adequate volume   Culture   Final    NO GROWTH < 24 HOURS Performed at [facility], [address] [place], [place] [address]    Report Status PENDING  Incomplete  MRSA PCR Screening     Status: None   Collection Time: 06/28/18  4:45 PM  Result Value Ref Range Status   MRSA by PCR NEGATIVE NEGATIVE Final    Comment:        The GeneXpert MRSA Assay (FDA approved for NASAL specimens only), is one component of a comprehensive MRSA colonization surveillance program. It is not intended to diagnose MRSA infection nor to guide or monitor treatment for MRSA infections. Performed at [facility], [address], [place], [place] [address]      Studies: Ct Abdomen Pelvis W Contrast  Result Date: 06/28/2018 CLINICAL DATA:  82 year old male postoperative day 9 from colectomy and colostomy for colon cancer performed at the [place]. Fever, abdominal pain, tachycardia. EXAM: CT ABDOMEN AND PELVIS WITH CONTRAST TECHNIQUE: Multidetector CT imaging of the abdomen and pelvis was performed using the standard protocol following bolus administration of intravenous contrast. 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 Resolved small left pleural effusion since that time. Mild cardiomegaly is increased. No pericardial effusion.  Calcified coronary artery atherosclerosis is evident. Hepatobiliary: Stable 15 millimeter hypodense area along the anterior right liver dome since 2015, benign. Stable similar small peripheral hypodense areas along the falciform ligament and anterior right lobe (images 25 and 36, respectively). Negative gallbladder. Pancreas: Negative. Spleen: Mild splenomegaly, otherwise negative. Adrenals/Urinary Tract: Small intermediate density right adrenal nodule measuring 10 millimeters is indeterminate and was not definitely present in 2015. The left adrenal appears normal. Mild bilateral nonspecific perinephric stranding. Symmetric renal contrast enhancement and excretion. Decompressed proximal ureters. Diminutive urinary bladder containing non dependent gas (series 2, image 81). No perivesical stranding. Stomach/Bowel: There is what appears to be a thick-walled blind-ending  rectum in the pelvis. There is trace adjacent pneumoperitoneum (series 2, image 81) and a small volume of free fluid. The rectal stump is difficult to differentiate from nondilated small bowel in the pelvis. There is a left side descending colostomy with no adverse features. The upstream large bowel is decompressed. The transverse colon is redundant. Normal appendix. Motion artifact in the lower abdomen. Decompressed terminal ileum. Decompressed distal small bowel is partially within the small or developing right inguinal hernia as demonstrated on coronal image 30. Similar but less pronounced bulge at the left inguinal ring with adjacent small bowel. The stomach and duodenum are decompressed. The proximal jejunum is mildly dilated with gas and fluid measuring 3 centimeters diameter. There is a gradual transition to decompressed mid and distal small bowel. No abdominal free fluid. Vascular/Lymphatic: Aortoiliac calcified atherosclerosis. The major arterial structures in the abdomen and pelvis are patent. Portal venous system is patent. No  lymphadenopathy. Reproductive: Marginal right inguinal hernia which contains a small volume of pneumoperitoneum (series 2, image 81). Other: Small volume pelvic free fluid.  Mild presacral stranding. Postoperative changes to the ventral abdominal wall with stranding. Trace fluid subjacent to the skin staples in the midline. No associated rim enhancement. The Musculoskeletal: Chronic lower posterior left rib fractures. Flowing osteophytes in the spine. Chronic L5-S1 ankylosis. No acute osseous abnormality identified. IMPRESSION: 1. Postoperative changes to the abdomen with descending colostomy and what appears to be a blind ending rectum. Mild rectal wall thickening, trace pneumoperitoneum and free fluid appear within normal limits for the postoperative state. 2. Proximal small bowel ileus suspected. No evidence of obstruction at the colostomy. Small bilateral inguinal hernias do marginally involve the distal small bowel, but do not appear consequential at this time. 3. Postoperative changes to the ventral abdomen likewise appear within normal limits. 4. Small indeterminate 10 mm right adrenal nodule. Small chronic low-density areas in the liver are stable since 2015 and benign. 5. Chronic right lung base fibrothorax. Aortic Atherosclerosis (ICD10-I70.0). Electronically Signed   By: Genevie Ann M.D.   On: 06/28/2018 13:00   Dg Chest Portable 1 View  Result Date: 06/28/2018 CLINICAL DATA:  Central line placement. EXAM: PORTABLE CHEST 1 VIEW COMPARISON:  06/28/2018 and prior chest radiographs FINDINGS: A LEFT IJ central venous catheter is noted with tip overlying the mid SVC. Cardiomegaly and mild pulmonary vascular congestion noted. Mild RIGHT basilar atelectasis noted. No pleural effusion or pneumothorax. No significant change otherwise. IMPRESSION: LEFT IJ central venous catheter with tip overlying the mid SVC. No pneumothorax. Cardiomegaly, mild pulmonary vascular congestion and RIGHT basilar atelectasis.  Electronically Signed   By: Margarette Canada M.D.   On: 06/28/2018 16:15   Dg Chest Port 1 View  Result Date: 06/28/2018 CLINICAL DATA:  Fever.  History of colon carcinoma EXAM: PORTABLE CHEST 1 VIEW COMPARISON:  February 20, 2014 FINDINGS: There is atelectatic change in the right base. Lungs elsewhere are clear. Heart is borderline prominent with pulmonary vascularity normal. No adenopathy. There is aortic atherosclerosis. There is degenerative change in each shoulder. IMPRESSION: Right base atelectasis. No edema or consolidation. Stable cardiac prominence. There is aortic atherosclerosis. Aortic Atherosclerosis (ICD10-I70.0). Electronically Signed   By: Lowella Grip III M.D.   On: 06/28/2018 11:13   Scheduled Meds: . Chlorhexidine Gluconate Cloth  6 each Topical Daily  . docusate sodium  100 mg Oral BID  . enoxaparin (LOVENOX) injection  40 mg Subcutaneous Q24H  . finasteride  5 mg Oral Daily  . sodium chloride flush  10-40 mL Intracatheter Q12H  . tamsulosin  0.4 mg Oral Daily   Continuous Infusions: . sodium chloride 1,000 mL (06/28/18 1523)  . ampicillin-sulbactam (UNASYN) IV Stopped (06/29/18 0617)  . norepinephrine (LEVOPHED) Adult infusion 6 mcg/min (06/29/18 0400)  . vancomycin Stopped (06/29/18 0041)    Principal Problem:   Severe sepsis (HCC) Active Problems:   Fever   Anemia   Leukocytosis   BPH (benign prostatic hyperplasia)   Lactic acidosis   Cellulitis   Thrombocytopenia (HCC)  Critical Care Time spent: 40 mins  Irwin Brakeman, MD, FAAFP [facility] Pager [phone]  If 7PM-7AM, please contact night-coverage [email or web address] Password [email or web address] 06/29/2018, 6:27 AM    LOS: 1 day

## 2018-06-29 NOTE — Progress Notes (Signed)
Taken to CT during CT right hand IV was accidentally dislodged. Tolerated trip well.

## 2018-06-29 NOTE — Progress Notes (Signed)
Pharmacy Antibiotic Note  Nicolas [name] is a 82 y.o. male admitted on 06/28/2018 with sepsis./wound infection.  Pharmacy has been consulted for Vancomycin and zosyn dosing.  Plan:  Continue Vancomycin 1000mg  IV every 12 hours.  Goal trough 15-20 mcg/mL. Zosyn 3.375gm IV q8h, EID over 4 hours F/U cxs and clinical progress Monitor V/S, labs and levels as indicated  Height: 5\' 11"  (180.3 cm) Weight: 199 lb 4.7 oz (90.4 kg) IBW/kg (Calculated) : 75.3  Temp (24hrs), Avg:99.2 F (37.3 C), Min:98.1 F (36.7 C), Max:103 F (39.4 C)  Recent Labs  Lab 06/28/18 1045 06/28/18 1052 06/28/18 1242 06/28/18 1843 06/29/18 0529 06/29/18 0740 06/29/18 0953  WBC 16.8*  --   --   --  49.1*  --  46.4*  CREATININE 0.99  --   --   --  1.12  --   --   LATICACIDVEN  --  2.5* 3.2* 3.6*  --  1.6  --     Estimated Creatinine Clearance: 60.5 mL/min (by C-G formula based on SCr of 1.12 mg/dL).    Allergies  Allergen Reactions  . Influenza Vaccines Nausea And Vomiting    Nausea and vomiting hours after flu shot two years in a row    Antimicrobials this admission: Vancomycin 7/26 >>  Unasyn 7/26 >> 7/27 Zosyn 7/27>> Zosyn x 1 dose in ED 7/26  Dose adjustments this admission: n/a  Microbiology results: 7/26 BCx: ngtd 7/26 UCx: pending 7/27 abdominal wound cx: pending 7/26  MRSA PCR: negative  Thank you for allowing pharmacy to be a part of this patient's care.  Isac Sarna, BS Pharm D, [place] Clinical Pharmacist Pager [phone] 06/29/2018 11:35 AM

## 2018-06-29 NOTE — Progress Notes (Signed)
06/29/2018 2:05 PM  Came to reassess patient and found him lying in bed, he appears comfortable, vitals stable, norepinephrine infusion down to 5 mcgs, wife at bedside and was updated about patient's condition, elevated WBC and my conversation with surgeon. Dr. Constance Haw planning to reassess him later today and make decision about debridement.  Pt did not want to address code status but wanted to defer decision to wife.  I spoke with wife and she needs more time to process but plans to let us know at a later time.  He remains full code at this time.    Murvin Natal MD

## 2018-06-29 NOTE — Progress Notes (Signed)
[facility]  Full Consult to Follow.  Patient POD 10 s/p Colectomy with end colostomy for cancer. CT from yesterday reviewed with some minimal fluid in the pelvis, and a dot of air at the rectal stump.  C dif sent and negative. Worry with WBC being 49-46 what could be going on.    Opened inferior portion of wound that was red, some brownish fluid sent, not really dishwater, fascia intact. Packing placed. Ostomy with some skin sloughing on the lateral edge, some induration. Ostomy pale on 1/3 of 2/3 with some superficial necrosis, looked with glass tube and flash light, deeper looks pale but not necrotic, digitized without issue.       Sepsis/ WBC of unknown etiology. I would not think the superficial wound infection would drive it that hight. Also the small fluid and dot of air at the rectal stump is not unusual post op.  C dif was sent and is negative.  Ostomy looking dusky with superficial necrosis/ sloughing but this could be from pressors.   Recommend Zosyn and Vanc. Agree with repeat CT a/p with oral and IV contrast to assess for soemthign we are missing given jump in WBC, Could have ischemic gut? Rectal stump blow out? But exam is soft and relatively nontender except in the LLQ around the ostomy and in the inferior wound.  Check Fungal infection/ blood cultures.  Will follow. Discussed with Dr. Wynetta Emery.  Curlene Labrum, MD [facility] [address] [place], [place] [address] [phone] (office)

## 2018-06-29 NOTE — Progress Notes (Signed)
Ct read reviewed. Ostomy with some edema and gas around it. On physical exam no obvious area that needed debridement but ostomy looked superficially necrotic. Digitalized without issue. Some erythema around the ostomy.   Will need to reassess in a few hours to see if skin changes around ostomy worsening and if just delaying with skin infection versus necrotizing infection. Did not have that appearance earlier.   Will get Dr.  Wynetta Emery to add clindamycin for possible necrotizing infection difficult to exclude right now given wbc.  If skin/ changes worsening may need to open up area around ostomy or undergo debridement around ostomy.   Curlene Labrum, MD

## 2018-06-30 DIAGNOSIS — E876 Hypokalemia: Secondary | ICD-10-CM | POA: Diagnosis present

## 2018-06-30 LAB — CBC WITH DIFFERENTIAL/PLATELET
BASOS ABS: 0 10*3/uL (ref 0.0–0.1)
Basophils Relative: 0 %
EOS ABS: 0 10*3/uL (ref 0.0–0.7)
Eosinophils Relative: 0 %
HCT: 21.7 % — ABNORMAL LOW (ref 39.0–52.0)
Hemoglobin: 6.6 g/dL — CL (ref 13.0–17.0)
LYMPHS PCT: 4 %
Lymphs Abs: 0.7 10*3/uL (ref 0.7–4.0)
MCH: 33.2 pg (ref 26.0–34.0)
MCHC: 30.4 g/dL (ref 30.0–36.0)
MCV: 109 fL — ABNORMAL HIGH (ref 78.0–100.0)
MONO ABS: 2.2 10*3/uL — AB (ref 0.1–1.0)
Monocytes Relative: 12 %
Neutro Abs: 15.4 10*3/uL — ABNORMAL HIGH (ref 1.7–7.7)
Neutrophils Relative %: 84 %
PLATELETS: 82 10*3/uL — AB (ref 150–400)
RBC: 1.99 MIL/uL — ABNORMAL LOW (ref 4.22–5.81)
RDW: 21.1 % — AB (ref 11.5–15.5)
WBC: 18.3 10*3/uL — ABNORMAL HIGH (ref 4.0–10.5)

## 2018-06-30 LAB — COMPREHENSIVE METABOLIC PANEL
ALK PHOS: 72 U/L (ref 38–126)
ALT: 27 U/L (ref 0–44)
ANION GAP: 6 (ref 5–15)
AST: 31 U/L (ref 15–41)
Albumin: 2.2 g/dL — ABNORMAL LOW (ref 3.5–5.0)
BUN: 20 mg/dL (ref 8–23)
CO2: 18 mmol/L — AB (ref 22–32)
Calcium: 6.6 mg/dL — ABNORMAL LOW (ref 8.9–10.3)
Chloride: 114 mmol/L — ABNORMAL HIGH (ref 98–111)
Creatinine, Ser: 1 mg/dL (ref 0.61–1.24)
GFR calc Af Amer: >60 mL/min (ref >60)
GFR calc non Af Amer: >60 mL/min (ref >60)
GLUCOSE: 90 mg/dL (ref 70–99)
POTASSIUM: 3.8 mmol/L (ref 3.5–5.1)
Sodium: 138 mmol/L (ref 135–145)
Total Bilirubin: 1.2 mg/dL (ref 0.3–1.2)
Total Protein: 5.2 g/dL — ABNORMAL LOW (ref 6.5–8.1)

## 2018-06-30 LAB — RETICULOCYTES
RBC.: 2.02 MIL/uL — AB (ref 4.22–5.81)
RETIC CT PCT: 0.8 % (ref 0.4–3.1)
Retic Count, Absolute: 16.2 10*3/uL — ABNORMAL LOW (ref 19.0–186.0)

## 2018-06-30 LAB — URINE CULTURE: Culture: <10000 — AB

## 2018-06-30 LAB — PREPARE RBC (CROSSMATCH)

## 2018-06-30 LAB — FERRITIN: Ferritin: 566 ng/mL — ABNORMAL HIGH (ref 24–336)

## 2018-06-30 LAB — ABO/RH: ABO/RH(D): O NEG

## 2018-06-30 LAB — IRON AND TIBC
Iron: 74 ug/dL (ref 45–182)
Saturation Ratios: 64 % — ABNORMAL HIGH (ref 17.9–39.5)
TIBC: 116 ug/dL — AB (ref 250–450)
UIBC: 42 ug/dL

## 2018-06-30 LAB — MAGNESIUM: MAGNESIUM: 2.8 mg/dL — AB (ref 1.7–2.4)

## 2018-06-30 LAB — HEMOGLOBIN AND HEMATOCRIT, BLOOD
HCT: 28.5 % — ABNORMAL LOW (ref 39.0–52.0)
HEMOGLOBIN: 8.8 g/dL — AB (ref 13.0–17.0)

## 2018-06-30 LAB — VITAMIN B12: VITAMIN B 12: 1145 pg/mL — AB (ref 180–914)

## 2018-06-30 LAB — FOLATE: FOLATE: 30.1 ng/mL (ref >5.9)

## 2018-06-30 MED ORDER — SODIUM CHLORIDE 0.9 % IV SOLN
2.0000 g | Freq: Once | INTRAVENOUS | Status: AC
  Administered 2018-06-30: 2 g via INTRAVENOUS
  Filled 2018-06-30: qty 20

## 2018-06-30 MED ORDER — SODIUM CHLORIDE 0.9 % IV SOLN
1000.0000 mL | INTRAVENOUS | Status: DC
  Administered 2018-06-30: 1000 mL via INTRAVENOUS

## 2018-06-30 MED ORDER — SODIUM CHLORIDE 0.9% IV SOLUTION
Freq: Once | INTRAVENOUS | Status: AC
  Administered 2018-06-30: 11:00:00 via INTRAVENOUS

## 2018-06-30 MED ORDER — SODIUM CHLORIDE 0.9 % IV SOLN
1000.0000 mL | INTRAVENOUS | Status: DC
  Administered 2018-06-30 – 2018-07-02 (×3): 1000 mL via INTRAVENOUS

## 2018-06-30 MED ORDER — FUROSEMIDE 10 MG/ML IJ SOLN
20.0000 mg | Freq: Once | INTRAMUSCULAR | Status: AC
  Administered 2018-06-30: 20 mg via INTRAVENOUS
  Filled 2018-06-30: qty 2

## 2018-06-30 NOTE — Progress Notes (Signed)
[facility] Progress Note     Subjective: No major issues overnight. H&H dropped this AM. NO signs of bleeding but could be stress response? Fluid with 6L+.  Getting blood. He overall feels fine. NO complaints. Ostomy making liquid stool.  Remains off pressors.   Objective: Vital signs in last 24 hours: Temp:  [97.5 F (36.4 C)-98.4 F (36.9 C)] 97.5 F (36.4 C) (07/28 1100) Pulse Rate:  [55-81] 59 (07/28 1100) Resp:  [16-28] 20 (07/28 1100) BP: (82-109)/(48-82) 101/50 (07/28 1100) SpO2:  [90 %-99 %] 95 % (07/28 1100)    Intake/Output from previous day: 07/27 0701 - 07/28 0700 In: 7024.4 [P.O.:490; I.V.:4555; IV Piggyback:1979.4] Out: 1325 [Urine:750; Stool:575] Intake/Output this shift: Total I/O In: 308.8 [I.V.:285; IV Piggyback:23.8] Out: -   General appearance: alert, cooperative and no distress Resp: normal work breathing GI: soft, appropriately tender around wound and ostomy, wound inferior with packing, changed, no drainage, erythema decreased, ostomy wafer removed, ostomy with superficial mucosa necrosis, deeper mucosa pink and serosa i can see on lateral edge pink, making liquid in bag, tenderness appropriate around ostomy, erythema not progressing, lateral area of superficial epidermis sloughin with some superficial necrosis too   Improving erythema/ superficial necrosis mucosa    Serosa on lateral edge that is exposed pink, no major drainage around the ostomy, appropriately tender around    Deeper mucosa pink    Lab Results:  Recent Labs    06/29/18 0953 06/30/18 0457  WBC 46.4* 18.3*  HGB 7.6* 6.6*  HCT 24.6* 21.7*  PLT 115* 82*   BMET Recent Labs    06/29/18 0529 06/30/18 0457  NA 140 138  K 4.4 3.8  CL 115* 114*  CO2 19* 18*  GLUCOSE 109* 90  BUN 20 20  CREATININE 1.12 1.00  CALCIUM 6.7* 6.6*   PT/INR Recent Labs    06/28/18 1045  LABPROT 16.9*  INR 1.39    Studies/Results: Ct Abdomen Pelvis W  Contrast  Result Date: 06/29/2018 CLINICAL DATA:  Increasing white blood cell count. Post recent colectomy and colostomy for colon cancer. EXAM: CT ABDOMEN AND PELVIS WITH CONTRAST TECHNIQUE: Multidetector CT imaging of the abdomen and pelvis was performed using the standard protocol following bolus administration of intravenous contrast. CONTRAST:  58mL ISOVUE-300 IOPAMIDOL (ISOVUE-300) INJECTION 61%, 167mL ISOVUE-300 IOPAMIDOL (ISOVUE-300) INJECTION 61% COMPARISON:  06/28/2018 FINDINGS: Lower chest: Chronic right lower lobe volume loss and linear peribronchial consolidation. Small bilateral pleural effusions. 11 mm soft tissue nodule in the inferior aspect of the right upper lobe, image 3/29, sequence 5, and image 74/107, sequence 6. Right hilar and subcarinal lymphadenopathy. Mild cardiomegaly. Calcific atherosclerotic disease of the coronary arteries, and aorta. Annular calcifications of the mitral and aortic valves. Hepatobiliary: Persistent peripheral hypoattenuated liver nodules, indeterminate. Normal appearance of the gallbladder. Pancreas: Unremarkable. No pancreatic ductal dilatation or surrounding inflammatory changes. Spleen: Spleen upper limits of normal at 11 cm. Adrenals/Urinary Tract: 15 mm nodule abuts the lateral leaflet of the right adrenal gland. Normal left adrenal gland. Mild bilateral perinephric stranding. Bilateral lower lobe renal cysts and other too small to be actually characterized hypoattenuated circumscribed nodules. No evidence of hydronephrosis or hydroureter. The bladder is hyperdense, probably due to residual contrast from yesterday's study. Decreasing amount of gas within the contralateral portion of the urinary bladder. Stomach/Bowel: Normal appearance of the stomach and small bowel. Post left colectomy. Again seen is thick-walled blind ending rectum in the pelvis, with a trace residual free gas and a small amount of free  fluid in the pelvis. Left-sided colostomy with mildly  edematous appearance of the external portion of the bowel and small amount of subcutaneous gas surrounding the stoma. Vascular/Lymphatic: Aortic atherosclerosis. No enlarged abdominal or pelvic lymph nodes. Reproductive: The prostate gland is enlarged to 5 cm. Other: Right inguinal hernia contains non incarcerated small bowel loops and small amount of free gas. The skin staples have been removed from the midline longitudinal abdominal incision, which is open superficially. Musculoskeletal: Spondylosis of the lumbosacral spine. Chronic lower posterior left rib fractures. No acute osseous abnormality. IMPRESSION: Postoperative changes of the abdomen from left colectomy. Mildly edematous appearance of the exiting bowel at the left lower quadrant colostomy with small amount of subcutaneous gas surrounding the stoma. Please correlate to physical exam regarding peristomal infectious changes. Interval removal of skin staples with dehiscence of the superficial portion of the midline anterior abdominal wall surgical incision. Right inguinal hernia contains non incarcerated small bowel loops and small amount of free gas, presumably postsurgical. Small amount of free fluid and traces free gas in the pelvis, presumably postsurgical. 15 mm indeterminate exophytic right adrenal gland nodule. Attention on future follow-up is recommended. Indeterminate peripheral hypoattenuated liver nodules. 11 mm irregular soft tissue nodule in the inferior aspect of the right upper lobe. Consider one of the following in 3 months for both low-risk and high-risk individuals: (a) repeat chest CT, (b) follow-up PET-CT, or (c) tissue sampling. This recommendation follows the consensus statement: Guidelines for Management of Incidental Pulmonary Nodules Detected on CT Images: From the Fleischner Society 2017; Radiology 2017; 284:228-243. Right hilar and subcarinal lymphadenopathy. Persistent right lower lobe volume loss and peribronchial linear soft  tissue thickening. Small bilateral pleural effusions. Electronically Signed   By: Fidela Salisbury M.D.   On: 06/29/2018 13:05   Ct Abdomen Pelvis W Contrast  Result Date: 06/28/2018 CLINICAL DATA:  82 year old male postoperative day 9 from colectomy and colostomy for colon cancer performed at the [place]. Fever, abdominal pain, tachycardia. EXAM: CT ABDOMEN AND PELVIS WITH CONTRAST TECHNIQUE: Multidetector CT imaging of the abdomen and pelvis was performed using the standard protocol following bolus administration of intravenous contrast. 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 Resolved small left pleural effusion since that time. Mild cardiomegaly is increased. No pericardial effusion. Calcified coronary artery atherosclerosis is evident. Hepatobiliary: Stable 15 millimeter hypodense area along the anterior right liver dome since 2015, benign. Stable similar small peripheral hypodense areas along the falciform ligament and anterior right lobe (images 25 and 36, respectively). Negative gallbladder. Pancreas: Negative. Spleen: Mild splenomegaly, otherwise negative. Adrenals/Urinary Tract: Small intermediate density right adrenal nodule measuring 10 millimeters is indeterminate and was not definitely present in 2015. The left adrenal appears normal. Mild bilateral nonspecific perinephric stranding. Symmetric renal contrast enhancement and excretion. Decompressed proximal ureters. Diminutive urinary bladder containing non dependent gas (series 2, image 81). No perivesical stranding. Stomach/Bowel: There is what appears to be a thick-walled blind-ending rectum in the pelvis. There is trace adjacent pneumoperitoneum (series 2, image 81) and a small volume of free fluid. The rectal stump is difficult to differentiate from nondilated  small bowel in the pelvis. There is a left side descending colostomy with no adverse features. The upstream large bowel is decompressed. The transverse colon is redundant. Normal appendix. Motion artifact in the lower abdomen. Decompressed terminal ileum. Decompressed distal small bowel is partially within  the small or developing right inguinal hernia as demonstrated on coronal image 30. Similar but less pronounced bulge at the left inguinal ring with adjacent small bowel. The stomach and duodenum are decompressed. The proximal jejunum is mildly dilated with gas and fluid measuring 3 centimeters diameter. There is a gradual transition to decompressed mid and distal small bowel. No abdominal free fluid. Vascular/Lymphatic: Aortoiliac calcified atherosclerosis. The major arterial structures in the abdomen and pelvis are patent. Portal venous system is patent. No lymphadenopathy. Reproductive: Marginal right inguinal hernia which contains a small volume of pneumoperitoneum (series 2, image 81). Other: Small volume pelvic free fluid.  Mild presacral stranding. Postoperative changes to the ventral abdominal wall with stranding. Trace fluid subjacent to the skin staples in the midline. No associated rim enhancement. The Musculoskeletal: Chronic lower posterior left rib fractures. Flowing osteophytes in the spine. Chronic L5-S1 ankylosis. No acute osseous abnormality identified. IMPRESSION: 1. Postoperative changes to the abdomen with descending colostomy and what appears to be a blind ending rectum. Mild rectal wall thickening, trace pneumoperitoneum and free fluid appear within normal limits for the postoperative state. 2. Proximal small bowel ileus suspected. No evidence of obstruction at the colostomy. Small bilateral inguinal hernias do marginally involve the distal small bowel, but do not appear consequential at this time. 3. Postoperative changes to the ventral abdomen likewise appear within normal limits. 4. Small  indeterminate 10 mm right adrenal nodule. Small chronic low-density areas in the liver are stable since 2015 and benign. 5. Chronic right lung base fibrothorax. Aortic Atherosclerosis (ICD10-I70.0). Electronically Signed   By: Genevie Ann M.D.   On: 06/28/2018 13:00   Portable Chest 1 View  Result Date: 06/29/2018 CLINICAL DATA:  History of sepsis EXAM: PORTABLE CHEST 1 VIEW COMPARISON:  06/28/2017 FINDINGS: Left jugular central line is again noted in the mid superior vena cava. Cardiac shadow remains mildly enlarged. Elevation the right hemidiaphragm is seen. Mild right basilar atelectasis is noted. No effusion or pneumothorax is seen. IMPRESSION: Mild right basilar atelectasis. Electronically Signed   By: Inez Catalina M.D.   On: 06/29/2018 08:29   Dg Chest Portable 1 View  Result Date: 06/28/2018 CLINICAL DATA:  Central line placement. EXAM: PORTABLE CHEST 1 VIEW COMPARISON:  06/28/2018 and prior chest radiographs FINDINGS: A LEFT IJ central venous catheter is noted with tip overlying the mid SVC. Cardiomegaly and mild pulmonary vascular congestion noted. Mild RIGHT basilar atelectasis noted. No pleural effusion or pneumothorax. No significant change otherwise. IMPRESSION: LEFT IJ central venous catheter with tip overlying the mid SVC. No pneumothorax. Cardiomegaly, mild pulmonary vascular congestion and RIGHT basilar atelectasis. Electronically Signed   By: Margarette Canada M.D.   On: 06/28/2018 16:15    Anti-infectives: Anti-infectives (From admission, onward)   Start     Dose/Rate Route Frequency Ordered Stop   06/29/18 1400  piperacillin-tazobactam (ZOSYN) IVPB 3.375 g     3.375 g 12.5 mL/hr over 240 Minutes Intravenous Every 8 hours 06/29/18 1140     06/29/18 1400  clindamycin (CLEOCIN) IVPB 900 mg  Status:  Discontinued     900 mg 100 mL/hr over 30 Minutes Intravenous Every 8 hours 06/29/18 1343 06/30/18 1130   06/29/18 1330  clindamycin (CLEOCIN) IVPB 300 mg  Status:  Discontinued     300  mg 100 mL/hr over 30 Minutes Intravenous Every 6 hours 06/29/18 1326 06/29/18 1343   06/29/18 1200  vancomycin (VANCOCIN) IVPB 1000 mg/200 mL premix     1,000 mg 200 mL/hr over 60  Minutes Intravenous Every 12 hours 06/29/18 1140     06/28/18 1800  Ampicillin-Sulbactam (UNASYN) 3 g in sodium chloride 0.9 % 100 mL IVPB  Status:  Discontinued     3 g 200 mL/hr over 30 Minutes Intravenous Every 6 hours 06/28/18 1432 06/29/18 1118   06/28/18 1100  vancomycin (VANCOCIN) 1,500 mg in sodium chloride 0.9 % 500 mL IVPB     1,500 mg 250 mL/hr over 120 Minutes Intravenous  Once 06/28/18 1047 06/28/18 1349   06/28/18 1045  piperacillin-tazobactam (ZOSYN) IVPB 3.375 g     3.375 g 100 mL/hr over 30 Minutes Intravenous  Once 06/28/18 1038 06/28/18 1210   06/28/18 1045  vancomycin (VANCOCIN) IVPB 1000 mg/200 mL premix  Status:  Discontinued     1,000 mg 200 mL/hr over 60 Minutes Intravenous  Once 06/28/18 1038 06/28/18 1047   06/28/18 0000  vancomycin (VANCOCIN) IVPB 1000 mg/200 mL premix  Status:  Discontinued     1,000 mg 200 mL/hr over 60 Minutes Intravenous Every 12 hours 06/28/18 1550 06/29/18 0846      Assessment/Plan: Mr. Halt is an 82 yo with severe septic shock that presented to the hospital and is POD 11 s/p partial colectomy with end colostomy for cancer at the [facility]. He is doing much better. Yesterday there was a brief period where we were concerned he could have a necrotizing infection. At this time, it does not look like this is the case and things are improving/ and the wounds are not progressing like a necrotizing infection would progress.  Likely ostomy mucosa necrosis from hypotension. Unsure if this caused a spiral and the increased WBC we saw early yesterday AM. WBC down to 18 this AM. Off all pressors since yesterday afternoon. H&H Down but no source of bleeding at this time. No hematemesis/ no blood in the stool noted. Getting blood, possibly from fluid resuscitation and stress  response.   -Continue to resuscitate -Getting blood -NPO with sips/chips -Would keep zosyn and vanc and can d/c clindamycin as no signs that we are dealing with necrotizing infection at this time as would have expected worsening picture  -Wound culture pending, dressing BID -Ostomy wafer can stay in place now, ostomy superficial necrosis will slough over the next week or two   Discussed with Dr. Wynetta Emery. Again if goes septic/ requiring additional pressors again or shows signs of shock, may have to take to OR for revision ostomy and exploration. This would be difficult and danger in POD 11 person, and would only be attempted if he were dying without other obvious cause.    LOS: 2 days    Virl Cagey 06/30/2018

## 2018-06-30 NOTE — Progress Notes (Addendum)
PROGRESS NOTE  Nicolas [name]  GYI:[id number]  DOB: Apr 06, 1936  DOA: 06/28/2018 PCP: Sharion Balloon, FNP  Brief Admission Hx: Nicolas [name] is a 82 y.o. male with colon cancer who is recently s/p colectomy with colostomy on 06/21/18 done at the [place] who presented to ED by EMS with complaints of fever up to 102 at home.  He was admitted with severe sepsis.   MDM/Assessment & Plan:  1. Severe Sepsis - suspected from cellulitis near wound closure sites. CT abdomen did not show abscess. With acute rise in WBC, will get a repeat CT abdomen STAT.  Lactate is trending down now.   Follow blood cultures, bolused IV fluids, IV antibiotics Unasyn/Vanc/clindamycin ordered.  Discussed with surgery will dc clindamycin, continue other antibiotics, follow clinically.  2. Leukocytosis - it is trending down now,  acute rise in WBC was concerning, repeated CT abdomen with contrast 7/27 did show some gas around ostomy site that was explored by Dr. Constance Haw at bedside, no collection of fluid to suggest abscess, added IV clindamycin to cover for necrotizing infection but low suspicion of that at this time.  Continue to follow clinically.   3. Post op s/p colectomy with ostomy placement - Pt has some early ischemia/necrosis of ostomy but it has been functioning and the center remains pink and much healthier appearing.  Dr. Constance Haw with surgery is following along and planning to reassess this morning.  4. Hypotension - Pt has a central line and had been on IV levophed to keep MAP>65.  He was taken off IV levophed 7/27.  Follow his blood pressure.  Restart pressors if needed.   5. Hypocalcemia - corrected calcium is down to 8, will give IV calcium gluconate, recheck in AM.   6. Acute blood loss Anemia - Hg down to 6.6.  Transfuse 2 units PRBCs.  Follow.    7. BPH - resumed home medications, follow I/Os.  Bladder scan ordered to be sure he is emptying bladder.   8. Fever - resolved for now.  Tylenol ordered as needed.   9. Hypokalemia / Hypomagnesemia - IV replacement ordered.  Follow.   10. Thrombocytopenia - dc lovenox, SCDs for DVT prophylaxis.  11. Lactic acidosis - Resolved.  Treated with IV fluid boluses per sepsis protocol and IV fluids.   DVT Prophylaxis: SCDs  Code Status: Full   Family Communication: patient updated at bedside  Disposition Plan: Stepdown ICU   Antimicrobials:  unasyn 7/26-7/27  Zosyn 7/27   Vancomycin 7/26  Clindamycin 7/27-7/28  Subjective: Pt seen in ICU.  Pt denies complaints.    Objective: Vitals:   06/30/18 0400 06/30/18 0430 06/30/18 0500 06/30/18 0530  BP: (!) 103/56 (!) 106/52 102/62   Pulse: 65 63 (!) 59 62  Resp: 19 18 (!) 22 20  Temp: 98.3 F (36.8 C)     TempSrc: Oral     SpO2: 96% 95% 97% 97%  Weight:      Height:        Intake/Output Summary (Last 24 hours) at 06/30/2018 0644 Last data filed at 06/30/2018 0540 Gross per 24 hour  Intake 7024.37 ml  Output 1325 ml  Net 5699.37 ml   Filed Weights   06/28/18 1029 06/28/18 1649 06/29/18 0415  Weight: 86.2 kg (190 lb) 87.9 kg (193 lb 12.6 oz) 90.4 kg (199 lb 4.7 oz)   REVIEW OF SYSTEMS  As per history otherwise all reviewed and reported negative  Exam:  General exam: elderly male, lying in  bed, NAD. He is cooperative.   Respiratory system:  No crackles heard.  No increased work of breathing. Cardiovascular system: normal S1 & S2 heard.  Gastrointestinal system: Abdomen is nondistended, soft and nontender. Normal bowel sounds heard. Ostomy with some necrosis but pink in lumen with liquid dark brown stool seen.  Small open wound packed with gauze and no drainage.   Central nervous system: No focal neurological deficits. Extremities: no CCE. Skin: no severe skin breakdown, has sacral protection pad in place.    Data Reviewed: Basic Metabolic Panel: Recent Labs  Lab 06/28/18 1045 06/29/18 0529  NA 139 140  K 3.2* 4.4  CL 109 115*  CO2 21* 19*  GLUCOSE 109* 109*  BUN 15 20   CREATININE 0.99 1.12  CALCIUM 7.9* 6.7*  MG  --  1.5*   Liver Function Tests: Recent Labs  Lab 06/28/18 1045 06/29/18 0529  AST 22 33  ALT 26 24  ALKPHOS 111 78  BILITOT 1.5* 1.4*  PROT 6.3* 5.4*  ALBUMIN 3.0* 2.4*   No results for input(s): LIPASE, AMYLASE in the last 168 hours. No results for input(s): AMMONIA in the last 168 hours. CBC: Recent Labs  Lab 06/28/18 1045 06/29/18 0529 06/29/18 0953  WBC 16.8* 49.1* 46.4*  NEUTROABS 13.5 40.2* 39.4*  HGB 9.2* 7.3* 7.6*  HCT 30.8* 23.5* 24.6*  MCV 110.4* 109.8* 108.8*  PLT 135* 122* 115*   Cardiac Enzymes: Recent Labs  Lab 06/28/18 1105  TROPONINI 0.03*   CBG (last 3)  No results for input(s): GLUCAP in the last 72 hours. Recent Results (from the past 240 hour(s))  Culture, blood (Routine x 2)     Status: None (Preliminary result)   Collection Time: 06/28/18 10:51 AM  Result Value Ref Range Status   Specimen Description BLOOD RIGHT ARM  Final   Special Requests   Final    BOTTLES DRAWN AEROBIC AND ANAEROBIC Blood Culture results may not be optimal due to an inadequate volume of blood received in culture bottles   Culture   Final    NO GROWTH 2 DAYS Performed at [facility], [address], [place], [place] [address]    Report Status PENDING  Incomplete  Culture, blood (Routine x 2)     Status: None (Preliminary result)   Collection Time: 06/28/18 10:55 AM  Result Value Ref Range Status   Specimen Description BLOOD LEFT HAND  Final   Special Requests   Final    BOTTLES DRAWN AEROBIC AND ANAEROBIC Blood Culture adequate volume   Culture   Final    NO GROWTH 2 DAYS Performed at [facility], [address] [place], [place] [address]    Report Status PENDING  Incomplete  MRSA PCR Screening     Status: None   Collection Time: 06/28/18  4:45 PM  Result Value Ref Range Status   MRSA by PCR NEGATIVE NEGATIVE Final    Comment:        The GeneXpert MRSA Assay (FDA approved for NASAL specimens only), is one  component of a comprehensive MRSA colonization surveillance program. It is not intended to diagnose MRSA infection nor to guide or monitor treatment for MRSA infections. Performed at [facility], [address] [place], [place] [address]   C difficile quick scan w PCR reflex     Status: None   Collection Time: 06/29/18  8:48 AM  Result Value Ref Range Status   C Diff antigen NEGATIVE NEGATIVE Final   C Diff toxin  NEGATIVE NEGATIVE Final   C Diff interpretation No C. difficile detected.  Final    Comment: Performed at [facility], [address], [place], [place] [address]     Studies: Ct Abdomen Pelvis W Contrast  Result Date: 06/29/2018 CLINICAL DATA:  Increasing white blood cell count. Post recent colectomy and colostomy for colon cancer. EXAM: CT ABDOMEN AND PELVIS WITH CONTRAST TECHNIQUE: Multidetector CT imaging of the abdomen and pelvis was performed using the standard protocol following bolus administration of intravenous contrast. CONTRAST:  66mL ISOVUE-300 IOPAMIDOL (ISOVUE-300) INJECTION 61%, 175mL ISOVUE-300 IOPAMIDOL (ISOVUE-300) INJECTION 61% COMPARISON:  06/28/2018 FINDINGS: Lower chest: Chronic right lower lobe volume loss and linear peribronchial consolidation. Small bilateral pleural effusions. 11 mm soft tissue nodule in the inferior aspect of the right upper lobe, image 3/29, sequence 5, and image 74/107, sequence 6. Right hilar and subcarinal lymphadenopathy. Mild cardiomegaly. Calcific atherosclerotic disease of the coronary arteries, and aorta. Annular calcifications of the mitral and aortic valves. Hepatobiliary: Persistent peripheral hypoattenuated liver nodules, indeterminate. Normal appearance of the gallbladder. Pancreas: Unremarkable. No pancreatic ductal dilatation or surrounding inflammatory changes. Spleen: Spleen upper limits of normal at 11 cm. Adrenals/Urinary Tract: 15 mm nodule abuts the lateral leaflet of the right adrenal gland. Normal left adrenal gland.  Mild bilateral perinephric stranding. Bilateral lower lobe renal cysts and other too small to be actually characterized hypoattenuated circumscribed nodules. No evidence of hydronephrosis or hydroureter. The bladder is hyperdense, probably due to residual contrast from yesterday's study. Decreasing amount of gas within the contralateral portion of the urinary bladder. Stomach/Bowel: Normal appearance of the stomach and small bowel. Post left colectomy. Again seen is thick-walled blind ending rectum in the pelvis, with a trace residual free gas and a small amount of free fluid in the pelvis. Left-sided colostomy with mildly edematous appearance of the external portion of the bowel and small amount of subcutaneous gas surrounding the stoma. Vascular/Lymphatic: Aortic atherosclerosis. No enlarged abdominal or pelvic lymph nodes. Reproductive: The prostate gland is enlarged to 5 cm. Other: Right inguinal hernia contains non incarcerated small bowel loops and small amount of free gas. The skin staples have been removed from the midline longitudinal abdominal incision, which is open superficially. Musculoskeletal: Spondylosis of the lumbosacral spine. Chronic lower posterior left rib fractures. No acute osseous abnormality. IMPRESSION: Postoperative changes of the abdomen from left colectomy. Mildly edematous appearance of the exiting bowel at the left lower quadrant colostomy with small amount of subcutaneous gas surrounding the stoma. Please correlate to physical exam regarding peristomal infectious changes. Interval removal of skin staples with dehiscence of the superficial portion of the midline anterior abdominal wall surgical incision. Right inguinal hernia contains non incarcerated small bowel loops and small amount of free gas, presumably postsurgical. Small amount of free fluid and traces free gas in the pelvis, presumably postsurgical. 15 mm indeterminate exophytic right adrenal gland nodule. Attention on future  follow-up is recommended. Indeterminate peripheral hypoattenuated liver nodules. 11 mm irregular soft tissue nodule in the inferior aspect of the right upper lobe. Consider one of the following in 3 months for both low-risk and high-risk individuals: (a) repeat chest CT, (b) follow-up PET-CT, or (c) tissue sampling. This recommendation follows the consensus statement: Guidelines for Management of Incidental Pulmonary Nodules Detected on CT Images: From the Fleischner Society 2017; Radiology 2017; 284:228-243. Right hilar and subcarinal lymphadenopathy. Persistent right lower lobe volume loss and peribronchial linear soft tissue thickening. Small bilateral pleural effusions. Electronically Signed   By: Linwood Dibbles.D.  On: 06/29/2018 13:05   Ct Abdomen Pelvis W Contrast  Result Date: 06/28/2018 CLINICAL DATA:  82 year old male postoperative day 9 from colectomy and colostomy for colon cancer performed at the [place]. Fever, abdominal pain, tachycardia. EXAM: CT ABDOMEN AND PELVIS WITH CONTRAST TECHNIQUE: Multidetector CT imaging of the abdomen and pelvis was performed using the standard protocol following bolus administration of intravenous contrast. CONTRAST:  144mL ISOVUE-300 IOPAMIDOL (ISOVUE-300) INJECTION 61% COMPARISON:  [facility] CT Abdomen and Pelvis 02/19/2014 FINDINGS: Lower chest: Chronic right lower lobe scarring and fibrothorax is stable since 2015. Resolved small left pleural effusion since that time. Mild cardiomegaly is increased. No pericardial effusion. Calcified coronary artery atherosclerosis is evident. Hepatobiliary: Stable 15 millimeter hypodense area along the anterior right liver dome since 2015, benign. Stable similar small peripheral hypodense areas along the falciform ligament and anterior right lobe (images 25 and 36, respectively). Negative gallbladder. Pancreas: Negative. Spleen: Mild splenomegaly, otherwise negative. Adrenals/Urinary Tract: Small intermediate density  right adrenal nodule measuring 10 millimeters is indeterminate and was not definitely present in 2015. The left adrenal appears normal. Mild bilateral nonspecific perinephric stranding. Symmetric renal contrast enhancement and excretion. Decompressed proximal ureters. Diminutive urinary bladder containing non dependent gas (series 2, image 81). No perivesical stranding. Stomach/Bowel: There is what appears to be a thick-walled blind-ending rectum in the pelvis. There is trace adjacent pneumoperitoneum (series 2, image 81) and a small volume of free fluid. The rectal stump is difficult to differentiate from nondilated small bowel in the pelvis. There is a left side descending colostomy with no adverse features. The upstream large bowel is decompressed. The transverse colon is redundant. Normal appendix. Motion artifact in the lower abdomen. Decompressed terminal ileum. Decompressed distal small bowel is partially within the small or developing right inguinal hernia as demonstrated on coronal image 30. Similar but less pronounced bulge at the left inguinal ring with adjacent small bowel. The stomach and duodenum are decompressed. The proximal jejunum is mildly dilated with gas and fluid measuring 3 centimeters diameter. There is a gradual transition to decompressed mid and distal small bowel. No abdominal free fluid. Vascular/Lymphatic: Aortoiliac calcified atherosclerosis. The major arterial structures in the abdomen and pelvis are patent. Portal venous system is patent. No lymphadenopathy. Reproductive: Marginal right inguinal hernia which contains a small volume of pneumoperitoneum (series 2, image 81). Other: Small volume pelvic free fluid.  Mild presacral stranding. Postoperative changes to the ventral abdominal wall with stranding. Trace fluid subjacent to the skin staples in the midline. No associated rim enhancement. The Musculoskeletal: Chronic lower posterior left rib fractures. Flowing osteophytes in the  spine. Chronic L5-S1 ankylosis. No acute osseous abnormality identified. IMPRESSION: 1. Postoperative changes to the abdomen with descending colostomy and what appears to be a blind ending rectum. Mild rectal wall thickening, trace pneumoperitoneum and free fluid appear within normal limits for the postoperative state. 2. Proximal small bowel ileus suspected. No evidence of obstruction at the colostomy. Small bilateral inguinal hernias do marginally involve the distal small bowel, but do not appear consequential at this time. 3. Postoperative changes to the ventral abdomen likewise appear within normal limits. 4. Small indeterminate 10 mm right adrenal nodule. Small chronic low-density areas in the liver are stable since 2015 and benign. 5. Chronic right lung base fibrothorax. Aortic Atherosclerosis (ICD10-I70.0). Electronically Signed   By: Genevie Ann M.D.   On: 06/28/2018 13:00   Portable Chest 1 View  Result Date: 06/29/2018 CLINICAL DATA:  History of sepsis EXAM: PORTABLE CHEST 1 VIEW  COMPARISON:  06/28/2017 FINDINGS: Left jugular central line is again noted in the mid superior vena cava. Cardiac shadow remains mildly enlarged. Elevation the right hemidiaphragm is seen. Mild right basilar atelectasis is noted. No effusion or pneumothorax is seen. IMPRESSION: Mild right basilar atelectasis. Electronically Signed   By: Inez Catalina M.D.   On: 06/29/2018 08:29   Dg Chest Portable 1 View  Result Date: 06/28/2018 CLINICAL DATA:  Central line placement. EXAM: PORTABLE CHEST 1 VIEW COMPARISON:  06/28/2018 and prior chest radiographs FINDINGS: A LEFT IJ central venous catheter is noted with tip overlying the mid SVC. Cardiomegaly and mild pulmonary vascular congestion noted. Mild RIGHT basilar atelectasis noted. No pleural effusion or pneumothorax. No significant change otherwise. IMPRESSION: LEFT IJ central venous catheter with tip overlying the mid SVC. No pneumothorax. Cardiomegaly, mild pulmonary vascular  congestion and RIGHT basilar atelectasis. Electronically Signed   By: Margarette Canada M.D.   On: 06/28/2018 16:15   Dg Chest Port 1 View  Result Date: 06/28/2018 CLINICAL DATA:  Fever.  History of colon carcinoma EXAM: PORTABLE CHEST 1 VIEW COMPARISON:  February 20, 2014 FINDINGS: There is atelectatic change in the right base. Lungs elsewhere are clear. Heart is borderline prominent with pulmonary vascularity normal. No adenopathy. There is aortic atherosclerosis. There is degenerative change in each shoulder. IMPRESSION: Right base atelectasis. No edema or consolidation. Stable cardiac prominence. There is aortic atherosclerosis. Aortic Atherosclerosis (ICD10-I70.0). Electronically Signed   By: Lowella Grip III M.D.   On: 06/28/2018 11:13   Scheduled Meds: . Chlorhexidine Gluconate Cloth  6 each Topical Daily  . docusate sodium  100 mg Oral BID  . enoxaparin (LOVENOX) injection  40 mg Subcutaneous Q24H  . finasteride  5 mg Oral Daily  . sodium chloride flush  10-40 mL Intracatheter Q12H  . tamsulosin  0.4 mg Oral Daily   Continuous Infusions: . sodium chloride 150 mL/hr at 06/29/18 1849  . clindamycin (CLEOCIN) IV Stopped (06/30/18 0540)  . norepinephrine (LEVOPHED) Adult infusion Stopped (06/29/18 1848)  . piperacillin-tazobactam (ZOSYN)  IV 3.375 g (06/30/18 0506)  . vancomycin Stopped (06/30/18 0130)   Principal Problem:   Severe sepsis (HCC) Active Problems:   Fever   Anemia   Leukocytosis   BPH (benign prostatic hyperplasia)   Lactic acidosis   Cellulitis   Thrombocytopenia (HCC)   Colostomy necrosis (HCC)   Postoperative wound infection  Critical Care Time spent: 58 mins  Irwin Brakeman, MD, FAAFP [facility] Pager [phone]  If 7PM-7AM, please contact night-coverage [email or web address] Password [facility] 06/30/2018, 6:44 AM    LOS: 2 days

## 2018-07-01 ENCOUNTER — Inpatient Hospital Stay (HOSPITAL_COMMUNITY): Payer: Non-veteran care

## 2018-07-01 DIAGNOSIS — E872 Acidosis, unspecified: Secondary | ICD-10-CM | POA: Diagnosis not present

## 2018-07-01 LAB — COMPREHENSIVE METABOLIC PANEL
ALK PHOS: 79 U/L (ref 38–126)
ALT: 41 U/L (ref 0–44)
AST: 37 U/L (ref 15–41)
Albumin: 2.3 g/dL — ABNORMAL LOW (ref 3.5–5.0)
Anion gap: 6 (ref 5–15)
BUN: 19 mg/dL (ref 8–23)
CALCIUM: 7.1 mg/dL — AB (ref 8.9–10.3)
CO2: 17 mmol/L — ABNORMAL LOW (ref 22–32)
CREATININE: 0.98 mg/dL (ref 0.61–1.24)
Chloride: 117 mmol/L — ABNORMAL HIGH (ref 98–111)
Glucose, Bld: 83 mg/dL (ref 70–99)
Potassium: 3.6 mmol/L (ref 3.5–5.1)
Sodium: 140 mmol/L (ref 135–145)
Total Bilirubin: 1.4 mg/dL — ABNORMAL HIGH (ref 0.3–1.2)
Total Protein: 5.5 g/dL — ABNORMAL LOW (ref 6.5–8.1)

## 2018-07-01 LAB — TYPE AND SCREEN
ABO/RH(D): O NEG
Antibody Screen: NEGATIVE
Unit division: 0
Unit division: 0

## 2018-07-01 LAB — LACTIC ACID, PLASMA: LACTIC ACID, VENOUS: 0.8 mmol/L (ref 0.5–1.9)

## 2018-07-01 LAB — CBC WITH DIFFERENTIAL/PLATELET
BASOS ABS: 0.2 10*3/uL — AB (ref 0.0–0.1)
BASOS PCT: 1 %
Band Neutrophils: 10 %
Eosinophils Absolute: 0 10*3/uL (ref 0.0–0.7)
Eosinophils Relative: 0 %
HCT: 29.5 % — ABNORMAL LOW (ref 39.0–52.0)
HEMOGLOBIN: 9 g/dL — AB (ref 13.0–17.0)
Lymphocytes Relative: 2 %
Lymphs Abs: 0.4 10*3/uL — ABNORMAL LOW (ref 0.7–4.0)
MCH: 31.1 pg (ref 26.0–34.0)
MCHC: 30.5 g/dL (ref 30.0–36.0)
MCV: 102.1 fL — ABNORMAL HIGH (ref 78.0–100.0)
METAMYELOCYTES PCT: 6 %
MYELOCYTES: 5 %
Monocytes Absolute: 0.7 10*3/uL (ref 0.1–1.0)
Monocytes Relative: 4 %
NEUTROS ABS: 15.9 10*3/uL — AB (ref 1.7–7.7)
NEUTROS PCT: 65 %
Other: 5 %
PROMYELOCYTES RELATIVE: 2 %
Platelets: 84 10*3/uL — ABNORMAL LOW (ref 150–400)
RBC: 2.89 MIL/uL — ABNORMAL LOW (ref 4.22–5.81)
RDW: 24.5 % — ABNORMAL HIGH (ref 11.5–15.5)
WBC: 18.1 10*3/uL — ABNORMAL HIGH (ref 4.0–10.5)

## 2018-07-01 LAB — MAGNESIUM: MAGNESIUM: 2.5 mg/dL — AB (ref 1.7–2.4)

## 2018-07-01 LAB — BPAM RBC
BLOOD PRODUCT EXPIRATION DATE: 201908232359
Blood Product Expiration Date: 201908232359
ISSUE DATE / TIME: 201907281038
ISSUE DATE / TIME: 201907281350
Unit Type and Rh: 9500
Unit Type and Rh: 9500

## 2018-07-01 LAB — BLOOD GAS, ARTERIAL
Acid-base deficit: 8.2 mmol/L — ABNORMAL HIGH (ref 0.0–2.0)
Bicarbonate: 18 mmol/L — ABNORMAL LOW (ref 20.0–28.0)
DRAWN BY: 270161
FIO2: 21
O2 Saturation: 90.6 %
PH ART: 7.379 (ref 7.350–7.450)
Patient temperature: 37
pCO2 arterial: 27.6 mmHg — ABNORMAL LOW (ref 32.0–48.0)
pO2, Arterial: 64.7 mmHg — ABNORMAL LOW (ref 83.0–108.0)

## 2018-07-01 LAB — VANCOMYCIN, TROUGH: VANCOMYCIN TR: 20 ug/mL (ref 15–20)

## 2018-07-01 MED ORDER — SODIUM CHLORIDE 0.9 % IV SOLN
1.0000 g | Freq: Once | INTRAVENOUS | Status: AC
  Administered 2018-07-01: 1 g via INTRAVENOUS
  Filled 2018-07-01: qty 10

## 2018-07-01 NOTE — Progress Notes (Signed)
Placed pt on 2L O2 per [place] per pt request. Pt stated he didn't feel short of breath but it would make him breathe easier- O2 sats on RA 92%. Will continue to monitor pt

## 2018-07-01 NOTE — Progress Notes (Signed)
Pharmacy Antibiotic Note  Nicolas [name] is a 82 y.o. male admitted on 06/28/2018 with sepsis./wound infection.  Pharmacy has been consulted for Vancomycin and zosyn dosing. Vancomycin trough reported as 67mcg/ml(drawn 1 hours prior to next dose).  Scr stable, VT is therapeutic. Afebrile, Blood cultures no growth to date and WBC improving. CT abdomen did not show abscess.   Plan:  Continue Vancomycin 1000mg  IV every 12 hours.  Goal trough 15-20 mcg/mL. Zosyn 3.375gm IV q8h, EID over 4 hours F/U cxs and clinical progress Monitor V/S, labs and levels as indicated F/U deescalation of abx  Height: 5\' 11"  (180.3 cm) Weight: 199 lb 4.7 oz (90.4 kg) IBW/kg (Calculated) : 75.3  Temp (24hrs), Avg:97.9 F (36.6 C), Min:97.3 F (36.3 C), Max:98.3 F (36.8 C)  Recent Labs  Lab 06/28/18 1045 06/28/18 1052 06/28/18 1242 06/28/18 1843 06/29/18 0529 06/29/18 0740 06/29/18 0953 06/29/18 1504 06/30/18 0457 07/01/18 0529 07/01/18 1047  WBC 16.8*  --   --   --  49.1*  --  46.4*  --  18.3* 18.1*  --   CREATININE 0.99  --   --   --  1.12  --   --   --  1.00 0.98  --   LATICACIDVEN  --  2.5* 3.2* 3.6*  --  1.6  --  1.0  --   --   --   VANCOTROUGH  --   --   --   --   --   --   --   --   --   --  20    Estimated Creatinine Clearance: 69.1 mL/min (by C-G formula based on SCr of 0.98 mg/dL).    Allergies  Allergen Reactions  . Influenza Vaccines Nausea And Vomiting    Nausea and vomiting hours after flu shot two years in a row    Antimicrobials this admission: Vancomycin 7/26 >>  Unasyn 7/26 >> 7/27 Zosyn 7/27>> Zosyn x 1 dose in ED 7/26  Dose adjustments this admission: n/a  Microbiology results: 7/26 BCx: ngtd 7/26 UCx: insignificant growth 7/27 abdominal wound cx: no growth 7/26  MRSA PCR: negative  Thank you for allowing pharmacy to be a part of this patient's care.  Isac Sarna, BS [place], [place] Clinical Pharmacist Pager [phone] 07/01/2018 12:58 PM

## 2018-07-01 NOTE — Progress Notes (Signed)
Pts HR dropping to 30s- ECG leads changed to make sure rhythm is correct. 2 RNs verified HR change. Dr. Kennon Holter paged and made aware of HR. Pt A&Ox4- asymptomatic. Waiting for call back/orders. Will continue to monitor pt

## 2018-07-01 NOTE — Progress Notes (Signed)
[facility] Progress Note     Subjective: Attempted to call Dr. Prince Rome MD surgeon at [facility], left a message [phone]). Overall looking good. BP and HR good. Not on pressors. No complaints. Ostomy making liquid stool.   Objective: Vital signs in last 24 hours: Temp:  [97.3 F (36.3 C)-98.3 F (36.8 C)] 97.3 F (36.3 C) (07/29 0400) Pulse Rate:  [55-74] 64 (07/29 1100) Resp:  [17-25] 23 (07/29 1100) BP: (113-157)/(50-74) 156/73 (07/29 1000) SpO2:  [91 %-97 %] 96 % (07/29 1100) Last BM Date: 07/01/18  Intake/Output from previous day: 07/28 0701 - 07/29 0700 In: 3579.8 [P.O.:120; I.V.:2286.7; Blood:640; IV Piggyback:533.1] Out: 2000 [Urine:2000] Intake/Output this shift: Total I/O In: 753.5 [I.V.:591.7; IV Piggyback:161.8] Out: 300 [Urine:150; Stool:150]  General appearance: alert, cooperative and no distress Resp: normal work breathing GI: soft, tender around midline and ostomy, no change in erythema and midline wound with packing, no drainage, ostomy with liquid stool, superficial necrosis of the mucosa   Lab Results:  Recent Labs    06/30/18 0457 06/30/18 2026 07/01/18 0529  WBC 18.3*  --  18.1*  HGB 6.6* 8.8* 9.0*  HCT 21.7* 28.5* 29.5*  PLT 82*  --  84*   BMET Recent Labs    06/30/18 0457 07/01/18 0529  NA 138 140  K 3.8 3.6  CL 114* 117*  CO2 18* 17*  GLUCOSE 90 83  BUN 20 19  CREATININE 1.00 0.98  CALCIUM 6.6* 7.1*   T bili 1.4 AST/ ALT 37/41  Studies/Results: Ct Abdomen Pelvis W Contrast  Result Date: 06/29/2018 CLINICAL DATA:  Increasing white blood cell count. Post recent colectomy and colostomy for colon cancer. EXAM: CT ABDOMEN AND PELVIS WITH CONTRAST TECHNIQUE: Multidetector CT imaging of the abdomen and pelvis was performed using the standard protocol following bolus administration of intravenous contrast. CONTRAST:  54mL ISOVUE-300 IOPAMIDOL (ISOVUE-300) INJECTION 61%, 165mL ISOVUE-300 IOPAMIDOL (ISOVUE-300) INJECTION 61%  COMPARISON:  06/28/2018 FINDINGS: Lower chest: Chronic right lower lobe volume loss and linear peribronchial consolidation. Small bilateral pleural effusions. 11 mm soft tissue nodule in the inferior aspect of the right upper lobe, image 3/29, sequence 5, and image 74/107, sequence 6. Right hilar and subcarinal lymphadenopathy. Mild cardiomegaly. Calcific atherosclerotic disease of the coronary arteries, and aorta. Annular calcifications of the mitral and aortic valves. Hepatobiliary: Persistent peripheral hypoattenuated liver nodules, indeterminate. Normal appearance of the gallbladder. Pancreas: Unremarkable. No pancreatic ductal dilatation or surrounding inflammatory changes. Spleen: Spleen upper limits of normal at 11 cm. Adrenals/Urinary Tract: 15 mm nodule abuts the lateral leaflet of the right adrenal gland. Normal left adrenal gland. Mild bilateral perinephric stranding. Bilateral lower lobe renal cysts and other too small to be actually characterized hypoattenuated circumscribed nodules. No evidence of hydronephrosis or hydroureter. The bladder is hyperdense, probably due to residual contrast from yesterday's study. Decreasing amount of gas within the contralateral portion of the urinary bladder. Stomach/Bowel: Normal appearance of the stomach and small bowel. Post left colectomy. Again seen is thick-walled blind ending rectum in the pelvis, with a trace residual free gas and a small amount of free fluid in the pelvis. Left-sided colostomy with mildly edematous appearance of the external portion of the bowel and small amount of subcutaneous gas surrounding the stoma. Vascular/Lymphatic: Aortic atherosclerosis. No enlarged abdominal or pelvic lymph nodes. Reproductive: The prostate gland is enlarged to 5 cm. Other: Right inguinal hernia contains non incarcerated small bowel loops and small amount of free gas. The skin staples have been removed from the midline longitudinal  abdominal incision, which is open  superficially. Musculoskeletal: Spondylosis of the lumbosacral spine. Chronic lower posterior left rib fractures. No acute osseous abnormality. IMPRESSION: Postoperative changes of the abdomen from left colectomy. Mildly edematous appearance of the exiting bowel at the left lower quadrant colostomy with small amount of subcutaneous gas surrounding the stoma. Please correlate to physical exam regarding peristomal infectious changes. Interval removal of skin staples with dehiscence of the superficial portion of the midline anterior abdominal wall surgical incision. Right inguinal hernia contains non incarcerated small bowel loops and small amount of free gas, presumably postsurgical. Small amount of free fluid and traces free gas in the pelvis, presumably postsurgical. 15 mm indeterminate exophytic right adrenal gland nodule. Attention on future follow-up is recommended. Indeterminate peripheral hypoattenuated liver nodules. 11 mm irregular soft tissue nodule in the inferior aspect of the right upper lobe. Consider one of the following in 3 months for both low-risk and high-risk individuals: (a) repeat chest CT, (b) follow-up PET-CT, or (c) tissue sampling. This recommendation follows the consensus statement: Guidelines for Management of Incidental Pulmonary Nodules Detected on CT Images: From the Fleischner Society 2017; Radiology 2017; 284:228-243. Right hilar and subcarinal lymphadenopathy. Persistent right lower lobe volume loss and peribronchial linear soft tissue thickening. Small bilateral pleural effusions. Electronically Signed   By: Fidela Salisbury M.D.   On: 06/29/2018 13:05   Dg Chest Port 1 View  Result Date: 07/01/2018 CLINICAL DATA:  82 year old male with a history of colon cancer and sepsis EXAM: PORTABLE CHEST 1 VIEW COMPARISON:  06/29/2018, 06/28/2018, 06/28/2018 FINDINGS: Cardiomediastinal silhouette unchanged in size and contour. Increasing opacity at the right lung base with obscuration of  the right hemidiaphragm and right heart border. Patchy opacities at the left lung base. No pneumothorax. No interlobular septal thickening. Unchanged position of left IJ central venous catheter with the tip appearing to terminate superior vena cava. IMPRESSION: Increasing opacity at the right lung base, potentially combination of pleural fluid, atelectasis/consolidation, and/or edema. Chronic lung changes. Unchanged left IJ central venous catheter. Electronically Signed   By: Corrie Mckusick D.O.   On: 07/01/2018 07:27    Anti-infectives: Anti-infectives (From admission, onward)   Start     Dose/Rate Route Frequency Ordered Stop   06/29/18 1400  piperacillin-tazobactam (ZOSYN) IVPB 3.375 g     3.375 g 12.5 mL/hr over 240 Minutes Intravenous Every 8 hours 06/29/18 1140     06/29/18 1400  clindamycin (CLEOCIN) IVPB 900 mg  Status:  Discontinued     900 mg 100 mL/hr over 30 Minutes Intravenous Every 8 hours 06/29/18 1343 06/30/18 1130   06/29/18 1330  clindamycin (CLEOCIN) IVPB 300 mg  Status:  Discontinued     300 mg 100 mL/hr over 30 Minutes Intravenous Every 6 hours 06/29/18 1326 06/29/18 1343   06/29/18 1200  vancomycin (VANCOCIN) IVPB 1000 mg/200 mL premix     1,000 mg 200 mL/hr over 60 Minutes Intravenous Every 12 hours 06/29/18 1140     06/28/18 1800  Ampicillin-Sulbactam (UNASYN) 3 g in sodium chloride 0.9 % 100 mL IVPB  Status:  Discontinued     3 g 200 mL/hr over 30 Minutes Intravenous Every 6 hours 06/28/18 1432 06/29/18 1118   06/28/18 1100  vancomycin (VANCOCIN) 1,500 mg in sodium chloride 0.9 % 500 mL IVPB     1,500 mg 250 mL/hr over 120 Minutes Intravenous  Once 06/28/18 1047 06/28/18 1349   06/28/18 1045  piperacillin-tazobactam (ZOSYN) IVPB 3.375 g     3.375 g 100 mL/hr  over 30 Minutes Intravenous  Once 06/28/18 1038 06/28/18 1210   06/28/18 1045  vancomycin (VANCOCIN) IVPB 1000 mg/200 mL premix  Status:  Discontinued     1,000 mg 200 mL/hr over 60 Minutes Intravenous  Once  06/28/18 1038 06/28/18 1047   06/28/18 0000  vancomycin (VANCOCIN) IVPB 1000 mg/200 mL premix  Status:  Discontinued     1,000 mg 200 mL/hr over 60 Minutes Intravenous Every 12 hours 06/28/18 1550 06/29/18 0846      Assessment/Plan: Mr. Skelley is an 82 yo with severe septic shock that presented to the hospital and is POD 12  s/p partial colectomy with end colostomy for cancer at the [facility]. Doing fair. Ostomy making output, superficial necrosis stabilized. No worsening of cellulitis or changes around ostomy or midline wound. HCO3 dropping on BMP. Discussed with Dr. Wynetta Emery, unsure of cause. No nausea/vomiting. -Clears ok today -Lactic and ABG being drawn to assess why might have the HCO3 trending down ? Compensation versus primary acidosis  -Wound packing BID  -Keep Zosyn and Vanc for now, WBC 18, no signs of necrotizing infection, other options is [place] RUQ if WBC not coming down and LFTs remain elevated, could be some degree of cholecystitis in this acute phase but no abdominal complaints  -Cultures without growth from midline  -H&H responded to blood    LOS: 3 days    Virl Cagey 07/01/2018

## 2018-07-01 NOTE — Evaluation (Signed)
Physical Therapy Evaluation Patient Details Name: Nicolas [name] MRN: [id number] DOB: [birth year] Today's Date: 07/01/2018   History of Present Illness  Nicolas [name] is a 82 y.o. male with colon cancer who is recently s/p colectomy with colostomy on 06/21/18 done at the [place] who presented to ED by EMS with complaints of fever up to 102 at home.  The says that he has had gradual rising in temperature since yesterday.  He reports no cough, no chest pain, no SOB, no palpitations, no neck or back pain and no vision changes.  He reports that his ostomy has been working and no blood seen in ostomy.  The patient says that he fell down a few days ago and bruised his left side but was seen by his PCP at [facility] and was told there were no serious injuries.      Clinical Impression  Patient demonstrates labored movement for sitting up requiring assistance, slightly unsteady on feet with fair/poor balance requiring use of RW for ambulation, no loss of balance noted and tolerated sitting up in chair after therapy - RN aware.  Patient will benefit from continued physical therapy in hospital and recommended venue below to increase strength, balance, endurance for safe ADLs and gait.    Follow Up Recommendations Home health PT;Supervision for mobility/OOB    Equipment Recommendations  None recommended by PT    Recommendations for Other Services       Precautions / Restrictions Precautions Precautions: Fall Restrictions Weight Bearing Restrictions: No      Mobility  Bed Mobility Overal bed mobility: Needs Assistance Bed Mobility: Supine to Sit     Supine to sit: Min assist     General bed mobility comments: required assistance to pull self up  Transfers Overall transfer level: Needs assistance Equipment used: Rolling walker (2 wheeled) Transfers: Sit to/from [facility] Sit to Stand: Min guard Stand pivot transfers: Min guard          Ambulation/Gait Ambulation/Gait assistance:  Min guard Gait Distance (Feet): 100 Feet Assistive device: Rolling walker (2 wheeled) Gait Pattern/deviations: Decreased step length - right;Decreased step length - left;Decreased stride length Gait velocity: decreased   General Gait Details: slightly labored cadence without loss of balance  Stairs            Wheelchair Mobility    Modified Rankin (Stroke Patients Only)       Balance Overall balance assessment: Needs assistance Sitting-balance support: Feet supported;No upper extremity supported Sitting balance-Leahy Scale: Good     Standing balance support: During functional activity;No upper extremity supported Standing balance-Leahy Scale: Poor Standing balance comment: fair/poor without AD, fair using RW                             Pertinent Vitals/Pain Pain Assessment: No/denies pain    Home Living Family/patient expects to be discharged to:: Private residence Living Arrangements: Spouse/significant other Available Help at Discharge: Family Type of Home: House Home Access: Stairs to enter Entrance Stairs-Rails: None Entrance Stairs-Number of Steps: 1 Home Layout: One level Home Equipment: Environmental consultant - 2 wheels      Prior Function Level of Independence: Independent         Comments: community ambulator, drives     Hand Dominance        Extremity/Trunk Assessment   Upper Extremity Assessment Upper Extremity Assessment: Generalized weakness    Lower Extremity Assessment Lower Extremity Assessment: Generalized weakness  Cervical / Trunk Assessment Cervical / Trunk Assessment: Normal  Communication   Communication: No difficulties  Cognition Arousal/Alertness: Awake/alert Behavior During Therapy: WFL for tasks assessed/performed Overall Cognitive Status: Within Functional Limits for tasks assessed                                        General Comments      Exercises     Assessment/Plan    PT Assessment  Patient needs continued PT services  PT Problem List Decreased strength;Decreased activity tolerance;Decreased balance;Decreased mobility       PT Treatment Interventions Gait training;Stair training;Functional mobility training;Therapeutic activities;Therapeutic exercise;Patient/family education    PT Goals (Current goals can be found in the Care Plan section)  Acute Rehab PT Goals Patient Stated Goal: return home Time For Goal Achievement: 07/18/18 Potential to Achieve Goals: Good    Frequency Min 3X/week   Barriers to discharge        Co-evaluation               AM-PAC PT "6 Clicks" Daily Activity  Outcome Measure Difficulty turning over in bed (including adjusting bedclothes, sheets and blankets)?: A Little Difficulty moving from lying on back to sitting on the side of the bed? : A Lot Difficulty sitting down on and standing up from a chair with arms (e.g., wheelchair, bedside commode, etc,.)?: A Little Help needed moving to and from a bed to chair (including a wheelchair)?: A Little Help needed walking in hospital room?: A Little Help needed climbing 3-5 steps with a railing? : A Little 6 Click Score: 17    End of Session Equipment Utilized During Treatment: Oxygen Activity Tolerance: Patient tolerated treatment well;Patient limited by fatigue Patient left: in chair;with call bell/phone within reach Nurse Communication: Mobility status PT Visit Diagnosis: Unsteadiness on feet (R26.81);Other abnormalities of gait and mobility (R26.89);Muscle weakness (generalized) (M62.81)    Time: 1423-9532 PT Time Calculation (min) (ACUTE ONLY): 30 min   Charges:   PT Evaluation $PT Eval Moderate Complexity: 1 Mod PT Treatments $Therapeutic Activity: 23-37 mins        2:59 PM, 07/01/18 Lonell Grandchild, MPT Physical Therapist with [facility] [phone] office [phone] mobile phone

## 2018-07-01 NOTE — Plan of Care (Signed)
  Problem: Acute Rehab PT Goals(only PT should resolve) Goal: Pt Will Go Supine/Side To Sit Outcome: Progressing Flowsheets (Taken 07/01/2018 1503) Pt will go Supine/Side to Sit: with supervision Goal: Patient Will Transfer Sit To/From Stand Outcome: Progressing Flowsheets (Taken 07/01/2018 1503) Patient will transfer sit to/from stand: with supervision Goal: Pt Will Transfer Bed To Chair/Chair To Bed Outcome: Progressing Flowsheets (Taken 07/01/2018 1503) Pt will Transfer Bed to Chair/Chair to Bed: with supervision Goal: Pt Will Ambulate Outcome: Progressing Flowsheets (Taken 07/01/2018 1503) Pt will Ambulate: > 125 feet;with supervision;with rolling walker   3:03 PM, 07/01/18 Lonell Grandchild, MPT Physical Therapist with [facility] 336 [phone] office [phone] mobile phone

## 2018-07-01 NOTE — Progress Notes (Addendum)
PROGRESS NOTE  Nicolas [name]  IHK:[id number]  DOB: Dec 29, 1935  DOA: 06/28/2018 PCP: Sharion Balloon, FNP  Brief Admission Hx: Nicolas [name] is a 82 y.o. male with colon cancer who is recently s/p colectomy with colostomy on 06/21/18 done at the [place] who presented to ED by EMS with complaints of fever up to 102 at home.  He was admitted with severe sepsis.   MDM/Assessment & Plan:  1. Severe Sepsis - Improving clinically.  Suspected from cellulitis near wound closure sites and ostomy necrosis.  CT abdomen did not show abscess.  Lactate is trending down now.   Follow blood cultures, bolused IV fluids, IV antibiotics ordered.  Discussed with surgery.  2. Leukocytosis - it is trending down but holding at 18.  repeated CT abdomen with contrast 7/27 did show some gas around ostomy site that was explored by Dr. Constance Haw at bedside, no collection of fluid to suggest abscess, added IV clindamycin to cover for necrotizing infection but low suspicion of that now and clindamycin discontinued.  Continue to follow clinically.   3. Post op s/p colectomy with ostomy placement - Pt has some early ischemia/necrosis of ostomy but it has been functioning and the center remains pink and much healthier appearing.  Dr. Constance Haw with surgery is following along and planning to reassess.  4. Hypotension -RESOLVED. Pt has a central line and had been on IV levophed to keep MAP>65.  He was taken off IV levophed 7/27.  Follow his blood pressure.  Restart pressors if needed.   5. Hypocalcemia - corrected calcium is down to 8, will give IV calcium gluconate, recheck in AM.   6. Metabolic acidosis - likely secondary to critical illness, following closely, may need to add sodium bicarbonate supplementation.  7. Acute blood loss Anemia - Hg down to 6.6.  Transfused 2 units PRBCs 7/28.  Hg up to 9.  Follow.    8. BPH - resumed home medications, follow I/Os.    9. Fever - resolved for now.  Tylenol ordered as needed.  10. Hypokalemia /  Hypomagnesemia - IV replacement ordered.  Follow.   11. Thrombocytopenia - discontinued lovenox, SCDs for DVT prophylaxis.  12. Lactic acidosis - Resolved.  Treated with IV fluid boluses per sepsis protocol and IV fluids.   DVT Prophylaxis: SCDs  Code Status: Full   Family Communication: wife updated at bedside  Disposition Plan: Stepdown ICU   Antimicrobials:  unasyn 7/26-7/27  Zosyn 7/27   Vancomycin 7/26  Clindamycin 7/27-7/28  Subjective: Pt seen in ICU. Pt says he slept well.  No complaints at this time.      Objective: Vitals:   07/01/18 0300 07/01/18 0400 07/01/18 0500 07/01/18 0600  BP: 131/61 129/66 (!) 157/69 129/74  Pulse: 62 61 74 63  Resp: 17 19 (!) 21 19  Temp:  (!) 97.3 F (36.3 C)    TempSrc:  Oral    SpO2: 92% 93% 91% 96%  Weight:      Height:        Intake/Output Summary (Last 24 hours) at 07/01/2018 0802 Last data filed at 07/01/2018 0510 Gross per 24 hour  Intake 3271.04 ml  Output 2000 ml  Net 1271.04 ml   Filed Weights   06/28/18 1029 06/28/18 1649 06/29/18 0415  Weight: 86.2 kg (190 lb) 87.9 kg (193 lb 12.6 oz) 90.4 kg (199 lb 4.7 oz)   REVIEW OF SYSTEMS  As per history otherwise all reviewed and reported negative  Exam:  General  exam: elderly male, lying in bed, NAD. He is cooperative.   Respiratory system:  No crackles heard.  No increased work of breathing. Cardiovascular system: normal S1 & S2 heard.  Gastrointestinal system: Abdomen is nondistended, soft and nontender. Normal bowel sounds heard. Ostomy with some necrosis but pink in lumen with liquid dark brown stool seen.  Small open wound packed with gauze and no drainage.   Central nervous system: No focal neurological deficits. Extremities: no CCE. Skin: no severe skin breakdown, has increased erythema on left side near ostomy.       Data Reviewed: Basic Metabolic Panel: Recent Labs  Lab 06/28/18 1045 06/29/18 0529 06/30/18 0457 07/01/18 0529  NA 139 140 138 140    K 3.2* 4.4 3.8 3.6  CL 109 115* 114* 117*  CO2 21* 19* 18* 17*  GLUCOSE 109* 109* 90 83  BUN 15 20 20 19   CREATININE 0.99 1.12 1.00 0.98  CALCIUM 7.9* 6.7* 6.6* 7.1*  MG  --  1.5* 2.8* 2.5*   Liver Function Tests: Recent Labs  Lab 06/28/18 1045 06/29/18 0529 06/30/18 0457 07/01/18 0529  AST 22 33 31 37  ALT 26 24 27  41  ALKPHOS 111 78 72 79  BILITOT 1.5* 1.4* 1.2 1.4*  PROT 6.3* 5.4* 5.2* 5.5*  ALBUMIN 3.0* 2.4* 2.2* 2.3*   No results for input(s): LIPASE, AMYLASE in the last 168 hours. No results for input(s): AMMONIA in the last 168 hours. CBC: Recent Labs  Lab 06/28/18 1045 06/29/18 0529 06/29/18 0953 06/30/18 0457 06/30/18 2026 07/01/18 0529  WBC 16.8* 49.1* 46.4* 18.3*  --  18.1*  NEUTROABS 13.5 40.2* 39.4* 15.4*  --  15.9*  HGB 9.2* 7.3* 7.6* 6.6* 8.8* 9.0*  HCT 30.8* 23.5* 24.6* 21.7* 28.5* 29.5*  MCV 110.4* 109.8* 108.8* 109.0*  --  102.1*  PLT 135* 122* 115* 82*  --  84*   Cardiac Enzymes: Recent Labs  Lab 06/28/18 1105  TROPONINI 0.03*   CBG (last 3)  No results for input(s): GLUCAP in the last 72 hours. Recent Results (from the past 240 hour(s))  Urine culture     Status: Abnormal   Collection Time: 06/28/18 10:38 AM  Result Value Ref Range Status   Specimen Description   Final    URINE, RANDOM Performed at [facility], [address], [place], [place] [address]    Special Requests   Final    NONE Performed at [facility], [address] [place], [place] [address]    Culture (A)  Final    <10,000 COLONIES/mL INSIGNIFICANT GROWTH Performed at [facility] [address], [place], [place] [address]    Report Status 06/30/2018 FINAL  Final  Culture, blood (Routine x 2)     Status: None (Preliminary result)   Collection Time: 06/28/18 10:51 AM  Result Value Ref Range Status   Specimen Description BLOOD RIGHT ARM  Final   Special Requests   Final    BOTTLES DRAWN AEROBIC AND ANAEROBIC Blood Culture results may not be optimal due  to an inadequate volume of blood received in culture bottles   Culture   Final    NO GROWTH 2 DAYS Performed at [facility], [address], [place], [place] [address]    Report Status PENDING  Incomplete  Culture, blood (Routine x 2)     Status: None (Preliminary result)   Collection Time: 06/28/18 10:55 AM  Result Value Ref Range Status   Specimen Description BLOOD LEFT HAND  Final   Special Requests   Final    BOTTLES DRAWN AEROBIC AND ANAEROBIC Blood Culture adequate volume   Culture   Final    NO GROWTH 2 DAYS Performed at [facility], [address] [place], [place] [address]    Report Status PENDING  Incomplete  MRSA PCR Screening     Status: None   Collection Time: 06/28/18  4:45 PM  Result Value Ref Range Status   MRSA by PCR NEGATIVE NEGATIVE Final    Comment:        The GeneXpert MRSA Assay (FDA approved for NASAL specimens only), is one component of a comprehensive MRSA colonization surveillance program. It is not intended to diagnose MRSA infection nor to guide or monitor treatment for MRSA infections. Performed at [facility], [address] [place], [place] [address]   C difficile quick scan w PCR reflex     Status: None   Collection Time: 06/29/18  8:48 AM  Result Value Ref Range Status   C Diff antigen NEGATIVE NEGATIVE Final   C Diff toxin NEGATIVE NEGATIVE Final   C Diff interpretation No C. difficile detected.  Final    Comment: Performed at [facility], [address] [place], [place] [address]  Aerobic Culture (superficial specimen)     Status: None (Preliminary result)   Collection Time: 06/29/18  9:58 AM  Result Value Ref Range Status   Specimen Description   Final    ABDOMEN Performed at [facility], [address] [place], [place] [address]    Special Requests   Final    NONE Performed at [facility], [address], [place], [place] [address]    Gram Stain   Final    RARE WBC PRESENT, PREDOMINANTLY PMN NO ORGANISMS SEEN    Culture    Final    NO GROWTH < 24 HOURS Performed at [facility] [address], [place], [place] [address]    Report Status PENDING  Incomplete     Studies: Ct Abdomen Pelvis W Contrast  Result Date: 06/29/2018 CLINICAL DATA:  Increasing white blood cell count. Post recent colectomy and colostomy for colon cancer. EXAM: CT ABDOMEN AND PELVIS WITH CONTRAST TECHNIQUE: Multidetector CT imaging of the abdomen and pelvis was performed using the standard protocol following bolus administration of intravenous contrast. CONTRAST:  6mL ISOVUE-300 IOPAMIDOL (ISOVUE-300) INJECTION 61%, 128mL ISOVUE-300 IOPAMIDOL (ISOVUE-300) INJECTION 61% COMPARISON:  06/28/2018 FINDINGS: Lower chest: Chronic right lower lobe volume loss and linear peribronchial consolidation. Small bilateral pleural effusions. 11 mm soft tissue nodule in the inferior aspect of the right upper lobe, image 3/29, sequence 5, and image 74/107, sequence 6. Right hilar and subcarinal lymphadenopathy. Mild cardiomegaly. Calcific atherosclerotic disease of the coronary arteries, and aorta. Annular calcifications of the mitral and aortic valves. Hepatobiliary: Persistent peripheral hypoattenuated liver nodules, indeterminate. Normal appearance of the gallbladder. Pancreas: Unremarkable. No pancreatic ductal dilatation or surrounding inflammatory changes. Spleen: Spleen upper limits of normal at 11 cm. Adrenals/Urinary Tract: 15 mm nodule abuts the lateral leaflet of the right adrenal gland. Normal left adrenal gland. Mild bilateral perinephric stranding. Bilateral lower lobe renal cysts and other too small to be actually characterized hypoattenuated circumscribed nodules. No evidence of hydronephrosis or hydroureter. The bladder is hyperdense, probably due to residual contrast from yesterday's study. Decreasing amount of gas within the contralateral portion of the urinary bladder. Stomach/Bowel: Normal appearance of the stomach and small bowel. Post left  colectomy. Again seen is thick-walled blind ending rectum  in the pelvis, with a trace residual free gas and a small amount of free fluid in the pelvis. Left-sided colostomy with mildly edematous appearance of the external portion of the bowel and small amount of subcutaneous gas surrounding the stoma. Vascular/Lymphatic: Aortic atherosclerosis. No enlarged abdominal or pelvic lymph nodes. Reproductive: The prostate gland is enlarged to 5 cm. Other: Right inguinal hernia contains non incarcerated small bowel loops and small amount of free gas. The skin staples have been removed from the midline longitudinal abdominal incision, which is open superficially. Musculoskeletal: Spondylosis of the lumbosacral spine. Chronic lower posterior left rib fractures. No acute osseous abnormality. IMPRESSION: Postoperative changes of the abdomen from left colectomy. Mildly edematous appearance of the exiting bowel at the left lower quadrant colostomy with small amount of subcutaneous gas surrounding the stoma. Please correlate to physical exam regarding peristomal infectious changes. Interval removal of skin staples with dehiscence of the superficial portion of the midline anterior abdominal wall surgical incision. Right inguinal hernia contains non incarcerated small bowel loops and small amount of free gas, presumably postsurgical. Small amount of free fluid and traces free gas in the pelvis, presumably postsurgical. 15 mm indeterminate exophytic right adrenal gland nodule. Attention on future follow-up is recommended. Indeterminate peripheral hypoattenuated liver nodules. 11 mm irregular soft tissue nodule in the inferior aspect of the right upper lobe. Consider one of the following in 3 months for both low-risk and high-risk individuals: (a) repeat chest CT, (b) follow-up PET-CT, or (c) tissue sampling. This recommendation follows the consensus statement: Guidelines for Management of Incidental Pulmonary Nodules Detected on CT  Images: From the Fleischner Society 2017; Radiology 2017; 284:228-243. Right hilar and subcarinal lymphadenopathy. Persistent right lower lobe volume loss and peribronchial linear soft tissue thickening. Small bilateral pleural effusions. Electronically Signed   By: Fidela Salisbury M.D.   On: 06/29/2018 13:05   Dg Chest Port 1 View  Result Date: 07/01/2018 CLINICAL DATA:  82 year old male with a history of colon cancer and sepsis EXAM: PORTABLE CHEST 1 VIEW COMPARISON:  06/29/2018, 06/28/2018, 06/28/2018 FINDINGS: Cardiomediastinal silhouette unchanged in size and contour. Increasing opacity at the right lung base with obscuration of the right hemidiaphragm and right heart border. Patchy opacities at the left lung base. No pneumothorax. No interlobular septal thickening. Unchanged position of left IJ central venous catheter with the tip appearing to terminate superior vena cava. IMPRESSION: Increasing opacity at the right lung base, potentially combination of pleural fluid, atelectasis/consolidation, and/or edema. Chronic lung changes. Unchanged left IJ central venous catheter. Electronically Signed   By: Corrie Mckusick D.O.   On: 07/01/2018 07:27   Portable Chest 1 View  Result Date: 06/29/2018 CLINICAL DATA:  History of sepsis EXAM: PORTABLE CHEST 1 VIEW COMPARISON:  06/28/2017 FINDINGS: Left jugular central line is again noted in the mid superior vena cava. Cardiac shadow remains mildly enlarged. Elevation the right hemidiaphragm is seen. Mild right basilar atelectasis is noted. No effusion or pneumothorax is seen. IMPRESSION: Mild right basilar atelectasis. Electronically Signed   By: Inez Catalina M.D.   On: 06/29/2018 08:29   Scheduled Meds: . Chlorhexidine Gluconate Cloth  6 each Topical Daily  . docusate sodium  100 mg Oral BID  . finasteride  5 mg Oral Daily  . sodium chloride flush  10-40 mL Intracatheter Q12H  . tamsulosin  0.4 mg Oral Daily   Continuous Infusions: . sodium chloride  1,000 mL (07/01/18 0607)  . calcium gluconate    . norepinephrine (LEVOPHED) Adult infusion Stopped (06/29/18  1848)  . piperacillin-tazobactam (ZOSYN)  IV 3.375 g (07/01/18 4825)  . vancomycin Stopped (07/01/18 0103)   Principal Problem:   Severe sepsis (HCC) Active Problems:   Fever   Anemia   Leukocytosis   BPH (benign prostatic hyperplasia)   Lactic acidosis   Cellulitis   Thrombocytopenia (HCC)   Colostomy necrosis (HCC)   Postoperative wound infection   Hypocalcemia   Hypokalemia   Hypomagnesemia   Metabolic acidosis  Critical Care Time spent: 31 mins  Irwin Brakeman, MD, FAAFP [facility] Pager [phone]  If 7PM-7AM, please contact night-coverage [email or web address] Password [email or web address] 07/01/2018, 8:02 AM    LOS: 3 days

## 2018-07-02 DIAGNOSIS — E876 Hypokalemia: Secondary | ICD-10-CM

## 2018-07-02 DIAGNOSIS — N401 Enlarged prostate with lower urinary tract symptoms: Secondary | ICD-10-CM

## 2018-07-02 DIAGNOSIS — R3914 Feeling of incomplete bladder emptying: Secondary | ICD-10-CM

## 2018-07-02 DIAGNOSIS — D696 Thrombocytopenia, unspecified: Secondary | ICD-10-CM

## 2018-07-02 LAB — CBC WITH DIFFERENTIAL/PLATELET
BASOS PCT: 0 %
Basophils Absolute: 0 10*3/uL (ref 0.0–0.1)
EOS ABS: 0 10*3/uL (ref 0.0–0.7)
EOS PCT: 0 %
HEMATOCRIT: 31.1 % — AB (ref 39.0–52.0)
Hemoglobin: 9.6 g/dL — ABNORMAL LOW (ref 13.0–17.0)
LYMPHS ABS: 0.6 10*3/uL — AB (ref 0.7–4.0)
Lymphocytes Relative: 3 %
MCH: 31.6 pg (ref 26.0–34.0)
MCHC: 30.9 g/dL (ref 30.0–36.0)
MCV: 102.3 fL — AB (ref 78.0–100.0)
MONO ABS: 4.2 10*3/uL — AB (ref 0.1–1.0)
Monocytes Relative: 21 %
NEUTROS PCT: 76 %
Neutro Abs: 15.1 10*3/uL — ABNORMAL HIGH (ref 1.7–7.7)
PLATELETS: 80 10*3/uL — AB (ref 150–400)
RBC: 3.04 MIL/uL — ABNORMAL LOW (ref 4.22–5.81)
RDW: 24 % — AB (ref 11.5–15.5)
WBC Morphology: INCREASED
WBC: 19.9 10*3/uL — ABNORMAL HIGH (ref 4.0–10.5)

## 2018-07-02 LAB — COMPREHENSIVE METABOLIC PANEL
ALK PHOS: 81 U/L (ref 38–126)
ALT: 38 U/L (ref 0–44)
AST: 27 U/L (ref 15–41)
Albumin: 2.2 g/dL — ABNORMAL LOW (ref 3.5–5.0)
Anion gap: 6 (ref 5–15)
BUN: 14 mg/dL (ref 8–23)
CALCIUM: 7.2 mg/dL — AB (ref 8.9–10.3)
CHLORIDE: 118 mmol/L — AB (ref 98–111)
CO2: 18 mmol/L — ABNORMAL LOW (ref 22–32)
CREATININE: 0.85 mg/dL (ref 0.61–1.24)
GFR calc non Af Amer: >60 mL/min (ref >60)
Glucose, Bld: 96 mg/dL (ref 70–99)
Potassium: 3.7 mmol/L (ref 3.5–5.1)
SODIUM: 142 mmol/L (ref 135–145)
Total Bilirubin: 1.4 mg/dL — ABNORMAL HIGH (ref 0.3–1.2)
Total Protein: 5.5 g/dL — ABNORMAL LOW (ref 6.5–8.1)

## 2018-07-02 LAB — BLOOD GAS, ARTERIAL
Acid-base deficit: 7 mmol/L — ABNORMAL HIGH (ref 0.0–2.0)
Bicarbonate: 18.9 mmol/L — ABNORMAL LOW (ref 20.0–28.0)
Drawn by: 28459
O2 Content: 2 L/min
O2 Saturation: 95.3 %
PATIENT TEMPERATURE: 37
PCO2 ART: 32 mmHg (ref 32.0–48.0)
PH ART: 7.357 (ref 7.350–7.450)
PO2 ART: 84.8 mmHg (ref 83.0–108.0)

## 2018-07-02 LAB — MAGNESIUM: Magnesium: 2.1 mg/dL (ref 1.7–2.4)

## 2018-07-02 LAB — PATHOLOGIST SMEAR REVIEW

## 2018-07-02 MED ORDER — POTASSIUM CL IN DEXTROSE 5% 20 MEQ/L IV SOLN
20.0000 meq | INTRAVENOUS | Status: DC
  Administered 2018-07-02 – 2018-07-04 (×5): 20 meq via INTRAVENOUS
  Filled 2018-07-02 (×8): qty 1000

## 2018-07-02 MED ORDER — POTASSIUM CHLORIDE 2 MEQ/ML IV SOLN: INTRAVENOUS | Status: DC

## 2018-07-02 NOTE — Consult Note (Signed)
[facility] Nurse ostomy follow up Stoma type/location: LLQ COlsotomy.  Surgery performed at [facility].  Admission for sepsis.  Ostomy supplies obtained.  LAst pouch change was 07/01/18.  Supplies at bedside.  Patient was home for one day after surgery and readmitted to [facility] for sepsis.  Has not changed pouch but has observed emptying.  Discussed twice weekly pouch changes and emptying when 1/3 full.  Stomal assessment/size: 1 1/2" pink patent and producing soft brown stool.  Peristomal assessment: pouch intact and changed yesterday.   Treatment options for stomal/peristomal skin: barrier ring Output soft brown stool Ostomy pouching: 1pc. Flat with barrier ring.  Education provided: see above Enrolled patient in [employer] Discharge program: No.  Has supplies arranged, he states.  WOc team will remain available.  Domenic Moras RN BSN [place] Pager [phone]

## 2018-07-02 NOTE — Progress Notes (Signed)
PROGRESS NOTE  MANSUR PATTI  RJJ:[id number]  DOB: [birth year]  DOA: 06/28/2018 PCP: Sharion Balloon, FNP  Brief Admission Hx: KRZYSZTOF REICHELT is a 82 y.o. male with colon cancer who is recently s/p colectomy with colostomy on 06/21/18 done at the [place] who presented to ED by EMS with complaints of fever up to 102 at home.  He was admitted with severe sepsis.   MDM/Assessment & Plan:  1. Severe Sepsis - Improving clinically.  Suspected from cellulitis near wound closure sites and ostomy necrosis.  CT abdomen did not show abscess.  Lactate is trending down now.   Blood cultures are shown no growth to date.  Currently on IV antibiotics.  Briefly required vasopressors, but has not been weaned off of pressors. Discussed with surgery.  2. Leukocytosis -WBC count ranging between 18-19, although this has trended down significantly since admission.  repeated CT abdomen with contrast 7/27 did show some gas around ostomy site that was explored by Dr. Constance Haw at bedside, no collection of fluid to suggest abscess.  No other obvious source of infection continue to follow clinically.   3. Post op s/p colectomy with ostomy placement - Pt has some early ischemia/necrosis of ostomy but it has been functioning and the center remains pink and much healthier appearing.  Dr. Constance Haw with surgery is following 4. Hypotension -RESOLVED. Pt has a central line and had been on IV levophed to keep MAP>65.  He was taken off IV levophed 7/27.  Blood pressures have been stable.  Will request peripheral IV access so that central line can be removed. 5. Hypocalcemia -corrected for albumin, calcium is 8.6.  We will continue to monitor 6. Non-anion gap metabolic acidosis- likely secondary to hyperchloremia.  Patient has been receiving IV normal saline.  Will change in favor of hypotonic dextrose.  Continue to follow electrolytes 7. Acute blood loss Anemia - Hg down to 6.6.  Transfused 2 units PRBCs 7/28.  Hg up to 9.6.  Follow.    8. BPH  - resumed home medications, follow I/Os.    9. Hypokalemia / Hypomagnesemia -replaced. 10. Thrombocytopenia -appears to be a chronic issue.  Continue to monitor.  11. Lactic acidosis - Resolved.  Treated with IV fluid boluses per sepsis protocol and IV fluids.   DVT Prophylaxis: SCDs  Code Status: Full   Family Communication:  No family present Disposition Plan: Stepdown ICU   Antimicrobials:  unasyn 7/26-7/27  Zosyn 7/27>   Vancomycin 7/26>  Clindamycin 7/27-7/28  Subjective: Patient denies any shortness of breath.  Wants to remove oxygen.  No cough.  Abdominal pain is better.  No vomiting.  Objective: Vitals:   07/02/18 1600 07/02/18 1700 07/02/18 1800 07/02/18 1900  BP:   (!) 143/78 135/60  Pulse: (!) 57 83 64 (!) 56  Resp: (!) 21 (!) 29 15 (!) 26  Temp:      TempSrc:      SpO2: 91% (!) 89% 93% 93%  Weight:      Height:        Intake/Output Summary (Last 24 hours) at 07/02/2018 1926 Last data filed at 07/02/2018 1900 Gross per 24 hour  Intake 3147.29 ml  Output 1425 ml  Net 1722.29 ml   Filed Weights   06/28/18 1649 06/29/18 0415 07/02/18 0400  Weight: 87.9 kg (193 lb 12.6 oz) 90.4 kg (199 lb 4.7 oz) 94.6 kg (208 lb 8.9 oz)   REVIEW OF SYSTEMS  As per history otherwise all reviewed and reported negative  Exam:  General exam: Alert, awake, oriented x 3 Respiratory system: Clear to auscultation. Respiratory effort normal. Cardiovascular system:RRR. No murmurs, rubs, gallops. Gastrointestinal system: Abdomen is nondistended, soft and nontender. No organomegaly or masses felt. Normal bowel sounds heard.  Colostomy left lower quadrant.  Wound in mid abdomen, dressing was not removed Central nervous system: Alert and oriented. No focal neurological deficits. Extremities: No C/C/E, +pedal pulses Skin: No rashes, lesions or ulcers Psychiatry: Judgement and insight appear normal. Mood & affect appropriate.   Basic Metabolic Panel: Recent Labs  Lab  06/28/18 1045 06/29/18 0529 06/30/18 0457 07/01/18 0529 07/02/18 0427  NA 139 140 138 140 142  K 3.2* 4.4 3.8 3.6 3.7  CL 109 115* 114* 117* 118*  CO2 21* 19* 18* 17* 18*  GLUCOSE 109* 109* 90 83 96  BUN 15 20 20 19 14   CREATININE 0.99 1.12 1.00 0.98 0.85  CALCIUM 7.9* 6.7* 6.6* 7.1* 7.2*  MG  --  1.5* 2.8* 2.5* 2.1   Liver Function Tests: Recent Labs  Lab 06/28/18 1045 06/29/18 0529 06/30/18 0457 07/01/18 0529 07/02/18 0427  AST 22 33 31 37 27  ALT 26 24 27  41 38  ALKPHOS 111 78 72 79 81  BILITOT 1.5* 1.4* 1.2 1.4* 1.4*  PROT 6.3* 5.4* 5.2* 5.5* 5.5*  ALBUMIN 3.0* 2.4* 2.2* 2.3* 2.2*   No results for input(s): LIPASE, AMYLASE in the last 168 hours. No results for input(s): AMMONIA in the last 168 hours. CBC: Recent Labs  Lab 06/29/18 0529 06/29/18 0953 06/30/18 0457 06/30/18 2026 07/01/18 0529 07/02/18 0427  WBC 49.1* 46.4* 18.3*  --  18.1* 19.9*  NEUTROABS 40.2* 39.4* 15.4*  --  15.9* 15.1*  HGB 7.3* 7.6* 6.6* 8.8* 9.0* 9.6*  HCT 23.5* 24.6* 21.7* 28.5* 29.5* 31.1*  MCV 109.8* 108.8* 109.0*  --  102.1* 102.3*  PLT 122* 115* 82*  --  84* 80*   Cardiac Enzymes: Recent Labs  Lab 06/28/18 1105  TROPONINI 0.03*   CBG (last 3)  No results for input(s): GLUCAP in the last 72 hours. Recent Results (from the past 240 hour(s))  Urine culture     Status: Abnormal   Collection Time: 06/28/18 10:38 AM  Result Value Ref Range Status   Specimen Description   Final    URINE, RANDOM Performed at [facility], [address] [place], [place] [address]    Special Requests   Final    NONE Performed at [facility], [address], [place], [place] [address]    Culture (A)  Final    <10,000 COLONIES/mL INSIGNIFICANT GROWTH Performed at [place] [address], [place], [place] [address]    Report Status 06/30/2018 FINAL  Final  Culture, blood (Routine x 2)     Status: None (Preliminary result)   Collection Time: 06/28/18 10:51 AM  Result Value Ref  Range Status   Specimen Description BLOOD RIGHT ARM  Final   Special Requests   Final    BOTTLES DRAWN AEROBIC AND ANAEROBIC Blood Culture results may not be optimal due to an inadequate volume of blood received in culture bottles   Culture   Final    NO GROWTH 4 DAYS Performed at [facility], [address] [place], [place] [address]    Report Status PENDING  Incomplete  Culture, blood (Routine x 2)     Status: None (Preliminary result)   Collection Time: 06/28/18 10:55 AM  Result Value Ref Range Status   Specimen Description  BLOOD LEFT HAND  Final   Special Requests   Final    BOTTLES DRAWN AEROBIC AND ANAEROBIC Blood Culture adequate volume   Culture   Final    NO GROWTH 4 DAYS Performed at [facility], [address], [place], [place] [address]    Report Status PENDING  Incomplete  MRSA PCR Screening     Status: None   Collection Time: 06/28/18  4:45 PM  Result Value Ref Range Status   MRSA by PCR NEGATIVE NEGATIVE Final    Comment:        The GeneXpert MRSA Assay (FDA approved for NASAL specimens only), is one component of a comprehensive MRSA colonization surveillance program. It is not intended to diagnose MRSA infection nor to guide or monitor treatment for MRSA infections. Performed at [facility], [address] [place], [place] [address]   C difficile quick scan w PCR reflex     Status: None   Collection Time: 06/29/18  8:48 AM  Result Value Ref Range Status   C Diff antigen NEGATIVE NEGATIVE Final   C Diff toxin NEGATIVE NEGATIVE Final   C Diff interpretation No C. difficile detected.  Final    Comment: Performed at [facility], [address] [place], [place] [address]  Aerobic Culture (superficial specimen)     Status: None (Preliminary result)   Collection Time: 06/29/18  9:58 AM  Result Value Ref Range Status   Specimen Description   Final    ABDOMEN Performed at [facility], [address] [place], [place] [address]    Special Requests   Final     NONE Performed at [facility], [address] [place], [place] [address]    Gram Stain   Final    RARE WBC PRESENT, PREDOMINANTLY PMN NO ORGANISMS SEEN    Culture   Final    NO GROWTH 3 DAYS Performed at [place] [address], [place], [place] [address]    Report Status PENDING  Incomplete     Studies: Dg Chest Port 1 View  Result Date: 07/01/2018 CLINICAL DATA:  82 year old male with a history of colon cancer and sepsis EXAM: PORTABLE CHEST 1 VIEW COMPARISON:  06/29/2018, 06/28/2018, 06/28/2018 FINDINGS: Cardiomediastinal silhouette unchanged in size and contour. Increasing opacity at the right lung base with obscuration of the right hemidiaphragm and right heart border. Patchy opacities at the left lung base. No pneumothorax. No interlobular septal thickening. Unchanged position of left IJ central venous catheter with the tip appearing to terminate superior vena cava. IMPRESSION: Increasing opacity at the right lung base, potentially combination of pleural fluid, atelectasis/consolidation, and/or edema. Chronic lung changes. Unchanged left IJ central venous catheter. Electronically Signed   By: Corrie Mckusick D.O.   On: 07/01/2018 07:27   Scheduled Meds: . Chlorhexidine Gluconate Cloth  6 each Topical Daily  . docusate sodium  100 mg Oral BID  . finasteride  5 mg Oral Daily  . sodium chloride flush  10-40 mL Intracatheter Q12H  . tamsulosin  0.4 mg Oral Daily   Continuous Infusions: . dextrose 5 % with KCl 20 mEq / L 20 mEq (07/02/18 1131)  . norepinephrine (LEVOPHED) Adult infusion Stopped (06/29/18 1848)  . piperacillin-tazobactam (ZOSYN)  IV Stopped (07/02/18 1831)  . vancomycin Stopped (07/02/18 1247)   Principal Problem:   Severe sepsis (HCC) Active Problems:   Fever   Anemia   Leukocytosis   BPH (benign prostatic hyperplasia)   Lactic acidosis   Cellulitis  Thrombocytopenia (HCC)   Colostomy necrosis (HCC)   Postoperative wound infection    Hypocalcemia   Hypokalemia   Hypomagnesemia   Metabolic acidosis  Time spent: 40mins  Kathie Dike, MD [facility] Pager [phone]  If 7PM-7AM, please contact night-coverage [email or web address] Password [email or web address] 07/02/2018, 7:26 PM    LOS: 4 days

## 2018-07-02 NOTE — Progress Notes (Signed)
[facility] Progress Note     Subjective: No major issues reported. Doing fair. Improving overall. No complaints. Not requiring additional support. WBC remains at 19 to 18. Says the greasy broth makes him nauseated/ no other nausea complaints.   Objective: Vital signs in last 24 hours: Temp:  [97.6 F (36.4 C)-98.3 F (36.8 C)] 97.6 F (36.4 C) (07/30 1111) Pulse Rate:  [54-68] 59 (07/30 1111) Resp:  [14-25] 22 (07/30 1111) BP: (121-156)/(48-70) 147/65 (07/30 0600) SpO2:  [92 %-99 %] 92 % (07/30 1111) Weight:  [208 lb 8.9 oz (94.6 kg)] 208 lb 8.9 oz (94.6 kg) (07/30 0400) Last BM Date: 07/01/18  Intake/Output from previous day: 07/29 0701 - 07/30 0700 In: 3644.8 [P.O.:600; I.V.:2391.7; IV Piggyback:653.1] Out: 1525 [Urine:1250; Stool:275] Intake/Output this shift: Total I/O In: -  Out: 300 [Urine:300]  General appearance: alert, cooperative and no distress Resp: normal work breathing GI: soft, nondistended, nontender in the RUQ or throughout, midline with lower packing which is clean without drainage, ostomy with liquid in bag, superficial mucosal necrosis stable Extremities: extremities normal, atraumatic, no cyanosis or edema  Lab Results:  Recent Labs    07/01/18 0529 07/02/18 0427  WBC 18.1* 19.9*  HGB 9.0* 9.6*  HCT 29.5* 31.1*  PLT 84* 80*   BMET Recent Labs    07/01/18 0529 07/02/18 0427  NA 140 142  K 3.6 3.7  CL 117* 118*  CO2 17* 18*  GLUCOSE 83 96  BUN 19 14  CREATININE 0.98 0.85  CALCIUM 7.1* 7.2*    Dg Chest Port 1 View  Result Date: 07/01/2018 CLINICAL DATA:  82 year old male with a history of colon cancer and sepsis EXAM: PORTABLE CHEST 1 VIEW COMPARISON:  06/29/2018, 06/28/2018, 06/28/2018 FINDINGS: Cardiomediastinal silhouette unchanged in size and contour. Increasing opacity at the right lung base with obscuration of the right hemidiaphragm and right heart border. Patchy opacities at the left lung base. No pneumothorax.  No interlobular septal thickening. Unchanged position of left IJ central venous catheter with the tip appearing to terminate superior vena cava. IMPRESSION: Increasing opacity at the right lung base, potentially combination of pleural fluid, atelectasis/consolidation, and/or edema. Chronic lung changes. Unchanged left IJ central venous catheter. Electronically Signed   By: Corrie Mckusick D.O.   On: 07/01/2018 07:27    Anti-infectives: Anti-infectives (From admission, onward)   Start     Dose/Rate Route Frequency Ordered Stop   06/29/18 1400  piperacillin-tazobactam (ZOSYN) IVPB 3.375 g     3.375 g 12.5 mL/hr over 240 Minutes Intravenous Every 8 hours 06/29/18 1140     06/29/18 1400  clindamycin (CLEOCIN) IVPB 900 mg  Status:  Discontinued     900 mg 100 mL/hr over 30 Minutes Intravenous Every 8 hours 06/29/18 1343 06/30/18 1130   06/29/18 1330  clindamycin (CLEOCIN) IVPB 300 mg  Status:  Discontinued     300 mg 100 mL/hr over 30 Minutes Intravenous Every 6 hours 06/29/18 1326 06/29/18 1343   06/29/18 1200  vancomycin (VANCOCIN) IVPB 1000 mg/200 mL premix     1,000 mg 200 mL/hr over 60 Minutes Intravenous Every 12 hours 06/29/18 1140     06/28/18 1800  Ampicillin-Sulbactam (UNASYN) 3 g in sodium chloride 0.9 % 100 mL IVPB  Status:  Discontinued     3 g 200 mL/hr over 30 Minutes Intravenous Every 6 hours 06/28/18 1432 06/29/18 1118   06/28/18 1100  vancomycin (VANCOCIN) 1,500 mg in sodium chloride 0.9 % 500 mL IVPB  1,500 mg 250 mL/hr over 120 Minutes Intravenous  Once 06/28/18 1047 06/28/18 1349   06/28/18 1045  piperacillin-tazobactam (ZOSYN) IVPB 3.375 g     3.375 g 100 mL/hr over 30 Minutes Intravenous  Once 06/28/18 1038 06/28/18 1210   06/28/18 1045  vancomycin (VANCOCIN) IVPB 1000 mg/200 mL premix  Status:  Discontinued     1,000 mg 200 mL/hr over 60 Minutes Intravenous  Once 06/28/18 1038 06/28/18 1047   06/28/18 0000  vancomycin (VANCOCIN) IVPB 1000 mg/200 mL premix  Status:   Discontinued     1,000 mg 200 mL/hr over 60 Minutes Intravenous Every 12 hours 06/28/18 1550 06/29/18 0846      Assessment/Plan: Mr. Larch is an 82 yo with severe septic shock that presented to the hospital and is POD 13  s/p partial colectomy with end colostomy for cancer at the [facility].  Doing fair. Does not like the greasy broth and sweet juices. Explained that we want to make sure no ileus too.  -Adv to fulls but will monitor, having some ostomy output. -Wound packing BID -Discussed with Dr. Roderic Palau plan for continue Zosyn/ vanc for now -RUQ nontender so don't suspect gallbladder to be driving issues, unsure of the exact source, and fluid around stump is minimal, so unlikely that IR can drain -Cultures without growth from midline      LOS: 4 days    Virl Cagey 07/02/2018

## 2018-07-03 LAB — COMPREHENSIVE METABOLIC PANEL
ALBUMIN: 2.3 g/dL — AB (ref 3.5–5.0)
ALT: 36 U/L (ref 0–44)
AST: 28 U/L (ref 15–41)
Alkaline Phosphatase: 79 U/L (ref 38–126)
Anion gap: 5 (ref 5–15)
BUN: 9 mg/dL (ref 8–23)
CHLORIDE: 113 mmol/L — AB (ref 98–111)
CO2: 19 mmol/L — ABNORMAL LOW (ref 22–32)
Calcium: 7.4 mg/dL — ABNORMAL LOW (ref 8.9–10.3)
Creatinine, Ser: 0.74 mg/dL (ref 0.61–1.24)
GFR calc Af Amer: >60 mL/min (ref >60)
GLUCOSE: 151 mg/dL — AB (ref 70–99)
Potassium: 3.7 mmol/L (ref 3.5–5.1)
SODIUM: 137 mmol/L (ref 135–145)
Total Bilirubin: 1.7 mg/dL — ABNORMAL HIGH (ref 0.3–1.2)
Total Protein: 5.3 g/dL — ABNORMAL LOW (ref 6.5–8.1)

## 2018-07-03 LAB — CBC WITH DIFFERENTIAL/PLATELET
Basophils Absolute: 0 10*3/uL (ref 0.0–0.1)
Basophils Relative: 0 %
Eosinophils Absolute: 0 10*3/uL (ref 0.0–0.7)
Eosinophils Relative: 0 %
HEMATOCRIT: 30.4 % — AB (ref 39.0–52.0)
Hemoglobin: 9.6 g/dL — ABNORMAL LOW (ref 13.0–17.0)
LYMPHS ABS: 0.7 10*3/uL (ref 0.7–4.0)
Lymphocytes Relative: 4 %
MCH: 31.7 pg (ref 26.0–34.0)
MCHC: 31.6 g/dL (ref 30.0–36.0)
MCV: 100.3 fL — ABNORMAL HIGH (ref 78.0–100.0)
Monocytes Absolute: 5.2 10*3/uL — ABNORMAL HIGH (ref 0.1–1.0)
Monocytes Relative: 28 %
Neutro Abs: 12.5 10*3/uL — ABNORMAL HIGH (ref 1.7–7.7)
Neutrophils Relative %: 68 %
Platelets: 73 10*3/uL — ABNORMAL LOW (ref 150–400)
RBC: 3.03 MIL/uL — AB (ref 4.22–5.81)
RDW: 22.9 % — ABNORMAL HIGH (ref 11.5–15.5)
WBC Morphology: INCREASED
WBC: 18.4 10*3/uL — AB (ref 4.0–10.5)

## 2018-07-03 LAB — CULTURE, BLOOD (ROUTINE X 2)
CULTURE: NO GROWTH
Culture: NO GROWTH
Special Requests: ADEQUATE

## 2018-07-03 LAB — AEROBIC CULTURE  (SUPERFICIAL SPECIMEN)

## 2018-07-03 LAB — AEROBIC CULTURE W GRAM STAIN (SUPERFICIAL SPECIMEN): Culture: NO GROWTH

## 2018-07-03 MED ORDER — SALINE SPRAY 0.65 % NA SOLN
1.0000 | NASAL | Status: DC | PRN
  Administered 2018-07-03: 1 via NASAL
  Filled 2018-07-03: qty 44

## 2018-07-03 MED ORDER — ORAL CARE MOUTH RINSE
15.0000 mL | Freq: Two times a day (BID) | OROMUCOSAL | Status: DC
  Administered 2018-07-03 – 2018-07-04 (×4): 15 mL via OROMUCOSAL

## 2018-07-03 NOTE — Progress Notes (Addendum)
[facility] Progress Note     Subjective: Improving. Tolerating fulls. Ostomy with liquid stool and gas. No complaints.   Objective: Vital signs in last 24 hours: Temp:  [97.7 F (36.5 C)-98.4 F (36.9 C)] 97.7 F (36.5 C) (07/31 0700) Pulse Rate:  [36-83] 62 (07/31 1000) Resp:  [2-29] 26 (07/31 1000) BP: (134-181)/(59-88) 134/71 (07/31 1000) SpO2:  [89 %-99 %] 93 % (07/31 1000) Weight:  [210 lb 12.2 oz (95.6 kg)] 210 lb 12.2 oz (95.6 kg) (07/31 0400) Last BM Date: 07/03/18  Intake/Output from previous day: 07/30 0701 - 07/31 0700 In: 2798.3 [P.O.:600; I.V.:1648.3; IV Piggyback:550] Out: 1050 [Urine:1050] Intake/Output this shift: Total I/O In: 30 [I.V.:30] Out: 575 [Urine:575]  General appearance: alert, cooperative and no distress Resp: normal work breathing GI: soft, appropriately tender, midline packed, ostomy with stool and gas, no RUQ tenderness   Lab Results:  Recent Labs    07/02/18 0427 07/03/18 0542  WBC 19.9* 18.4*  HGB 9.6* 9.6*  HCT 31.1* 30.4*  PLT 80* 73*   BMET Recent Labs    07/02/18 0427 07/03/18 0542  NA 142 137  K 3.7 3.7  CL 118* 113*  CO2 18* 19*  GLUCOSE 96 151*  BUN 14 9  CREATININE 0.85 0.74  CALCIUM 7.2* 7.4*   Anti-infectives: Anti-infectives (From admission, onward)   Start     Dose/Rate Route Frequency Ordered Stop   06/29/18 1400  piperacillin-tazobactam (ZOSYN) IVPB 3.375 g     3.375 g 12.5 mL/hr over 240 Minutes Intravenous Every 8 hours 06/29/18 1140     06/29/18 1400  clindamycin (CLEOCIN) IVPB 900 mg  Status:  Discontinued     900 mg 100 mL/hr over 30 Minutes Intravenous Every 8 hours 06/29/18 1343 06/30/18 1130   06/29/18 1330  clindamycin (CLEOCIN) IVPB 300 mg  Status:  Discontinued     300 mg 100 mL/hr over 30 Minutes Intravenous Every 6 hours 06/29/18 1326 06/29/18 1343   06/29/18 1200  vancomycin (VANCOCIN) IVPB 1000 mg/200 mL premix     1,000 mg 200 mL/hr over 60 Minutes Intravenous Every  12 hours 06/29/18 1140     06/28/18 1800  Ampicillin-Sulbactam (UNASYN) 3 g in sodium chloride 0.9 % 100 mL IVPB  Status:  Discontinued     3 g 200 mL/hr over 30 Minutes Intravenous Every 6 hours 06/28/18 1432 06/29/18 1118   06/28/18 1100  vancomycin (VANCOCIN) 1,500 mg in sodium chloride 0.9 % 500 mL IVPB     1,500 mg 250 mL/hr over 120 Minutes Intravenous  Once 06/28/18 1047 06/28/18 1349   06/28/18 1045  piperacillin-tazobactam (ZOSYN) IVPB 3.375 g     3.375 g 100 mL/hr over 30 Minutes Intravenous  Once 06/28/18 1038 06/28/18 1210   06/28/18 1045  vancomycin (VANCOCIN) IVPB 1000 mg/200 mL premix  Status:  Discontinued     1,000 mg 200 mL/hr over 60 Minutes Intravenous  Once 06/28/18 1038 06/28/18 1047   06/28/18 0000  vancomycin (VANCOCIN) IVPB 1000 mg/200 mL premix  Status:  Discontinued     1,000 mg 200 mL/hr over 60 Minutes Intravenous Every 12 hours 06/28/18 1550 06/29/18 0846      Assessment/Plan: Nicolas [name] is an 82 yo with severe septic shock that presented to the hospital and is POD 14s/p partial colectomy with end colostomy for cancer at the [facility].  Doing well. Tolerated diet. -Can adv diet to regular -Discussed with Dr. Roderic Palau, T bili going up, agree should try to rule out  cholecystitis given WBC continues to be elevated  -Midline BID packing wet to dry, no growth from culture  -Ostomy care     LOS: 5 days    Nicolas [name] 07/03/2018

## 2018-07-03 NOTE — Progress Notes (Signed)
Physical Therapy Treatment Patient Details Name: Nicolas [name] MRN: [id number] DOB: 09-Nov-1936 Today's Date: 07/03/2018    History of Present Illness Nicolas [name] is a 82 y.o. male with colon cancer who is recently s/p colectomy with colostomy on 06/21/18 done at the [place] who presented to ED by EMS with complaints of fever up to 102 at home.  The says that he has had gradual rising in temperature since yesterday.  He reports no cough, no chest pain, no SOB, no palpitations, no neck or back pain and no vision changes.  He reports that his ostomy has been working and no blood seen in ostomy.  The patient says that he fell down a few days ago and bruised his left side but was seen by his PCP at [facility] and was told there were no serious injuries.      PT Comments    Pt supine in bed and willing to participate.  Following discussion with RN, trial with therapy with O2 A. Pt with decreased O2 saturation to 83% following transition supine to sit so did utilize 2L O2 saturation A through nasal canal with O2 sat WNL through session.  Used RW with gait training, no LOB episodes noted, was limited by fatigue at EOS.  Pt left in chair with call bell within reach and RN aware of status.    Follow Up Recommendations  Home health PT;Supervision for mobility/OOB     Equipment Recommendations  None recommended by PT    Recommendations for Other Services       Precautions / Restrictions Precautions Precautions: Fall    Mobility  Bed Mobility Overal bed mobility: Modified Independent Bed Mobility: Supine to Sit     Supine to sit: Min guard     General bed mobility comments: increased time to complete, cueing for hand placement to assist with bed mobility  Transfers Overall transfer level: Needs assistance Equipment used: Rolling walker (2 wheeled) Transfers: Sit to/from Stand Sit to Stand: Min guard         General transfer comment: cueing for handplacement to assist with  STS  Ambulation/Gait Ambulation/Gait assistance: Min guard Gait Distance (Feet): 120 Feet Assistive device: Rolling walker (2 wheeled) Gait Pattern/deviations: Decreased step length - right;Decreased step length - left;Decreased stride length Gait velocity: decreased   General Gait Details: slightly labored cadence without loss of balance   Stairs             Wheelchair Mobility    Modified Rankin (Stroke Patients Only)       Balance                                            Cognition Arousal/Alertness: Awake/alert Behavior During Therapy: WFL for tasks assessed/performed Overall Cognitive Status: Within Functional Limits for tasks assessed                                        Exercises      General Comments        Pertinent Vitals/Pain Pain Assessment: No/denies pain    Home Living                      Prior Function  PT Goals (current goals can now be found in the care plan section)      Frequency    Min 3X/week      PT Plan Current plan remains appropriate    Co-evaluation              AM-PAC PT "6 Clicks" Daily Activity  Outcome Measure  Difficulty turning over in bed (including adjusting bedclothes, sheets and blankets)?: A Little Difficulty moving from lying on back to sitting on the side of the bed? : A Lot Difficulty sitting down on and standing up from a chair with arms (e.g., wheelchair, bedside commode, etc,.)?: A Little Help needed moving to and from a bed to chair (including a wheelchair)?: A Little   Help needed climbing 3-5 steps with a railing? : A Little 6 Click Score: 14    End of Session Equipment Utilized During Treatment: Gait belt;Oxygen Activity Tolerance: Patient tolerated treatment well;Patient limited by fatigue Patient left: in chair;with call bell/phone within reach Nurse Communication: Mobility status PT Visit Diagnosis: Unsteadiness on feet  (R26.81);Other abnormalities of gait and mobility (R26.89);Muscle weakness (generalized) (M62.81)     Time: 7414-2395 PT Time Calculation (min) (ACUTE ONLY): 28 min  Charges:  $Therapeutic Activity: 23-37 mins                     [address], LPTA; CBIS [phone]   Aldona Lento 07/03/2018, 1:20 PM

## 2018-07-03 NOTE — Progress Notes (Signed)
PROGRESS NOTE  Nicolas [name]  VCB:[id number]  DOB: [birth year]  DOA: 06/28/2018 PCP: Sharion Balloon, FNP  Brief Admission Hx: Nicolas [name] is a 82 y.o. male with colon cancer who is recently s/p colectomy with colostomy on 06/21/18 done at the [place] who presented to ED by EMS with complaints of fever up to 102 at home.  He was admitted with severe sepsis.   MDM/Assessment & Plan:  1. Severe Sepsis - Improving clinically.  Suspected from cellulitis near wound closure sites and ostomy necrosis.  CT abdomen did not show abscess.  Lactate is trending down now.   Blood cultures are shown no growth to date.  Currently on IV antibiotics.  Briefly required vasopressors, but has not been weaned off of pressors. Discussed with surgery.  2. Leukocytosis -WBC count ranging between 18-19, although this has trended down significantly since admission.  repeated CT abdomen with contrast 7/27 did show some gas around ostomy site that was explored by Dr. Constance Haw at bedside, no collection of fluid to suggest abscess.  No other obvious source of infection.  His bilirubin has been trending up over the past few days.  Right upper quadrant ultrasound has been ordered to evaluate for cholecystitis.  If no significant findings, can consider discontinuation of intravenous antibiotics over the next 24 hours.  Continue to follow clinically.   3. Post op s/p colectomy with ostomy placement - Pt has some early ischemia/necrosis of ostomy but it has been functioning and the center remains pink and much healthier appearing.  Dr. Constance Haw with surgery is following 4. Hypotension -RESOLVED. Pt has a central line and had been on IV levophed to keep MAP>65.  He was taken off IV levophed 7/27.  Blood pressures have been stable.  Will request peripheral IV access so that central line can be removed. 5. Hypocalcemia -corrected for albumin, calcium is 8.6.  We will continue to monitor 6. Non-anion gap metabolic acidosis- likely secondary to  hyperchloremia.  Patient had been receiving IV normal saline.  Fluids changed to dextrose with mild improvement of bicarb.  Continue to follow electrolytes 7. Acute blood loss Anemia - Hg down to 6.6.  Transfused 2 units PRBCs 7/28.  Hg up to 9.6.  Follow.    8. BPH - resumed home medications, follow I/Os.    9. Hypokalemia / Hypomagnesemia -replaced. 10. Thrombocytopenia -appears to be a chronic issue.  Continue to monitor.  11. Lactic acidosis - Resolved.  Treated with IV fluid boluses per sepsis protocol and IV fluids.   DVT Prophylaxis: SCDs  Code Status: Full   Family Communication:  No family present Disposition Plan:  Transfer to telemetry  Antimicrobials:  unasyn 7/26-7/27  Zosyn 7/27>   Vancomycin 7/26>  Clindamycin 7/27-7/28  Subjective: Denies any abdominal pain, nausea or vomiting.  No shortness of breath or cough.  Objective: Vitals:   07/03/18 1314 07/03/18 1400 07/03/18 1500 07/03/18 1600  BP:  (!) 141/59 (!) 158/73 (!) 139/59  Pulse:  (!) 49 61 60  Resp:  18 18 (!) 23  Temp:    97.7 F (36.5 C)  TempSrc:    Oral  SpO2: 97% 99% 96% 100%  Weight:      Height:        Intake/Output Summary (Last 24 hours) at 07/03/2018 1827 Last data filed at 07/03/2018 1727 Gross per 24 hour  Intake 3145.41 ml  Output 1950 ml  Net 1195.41 ml   Filed Weights   06/29/18 0415 07/02/18 0400  07/03/18 0400  Weight: 90.4 kg (199 lb 4.7 oz) 94.6 kg (208 lb 8.9 oz) 95.6 kg (210 lb 12.2 oz)   REVIEW OF SYSTEMS  As per history otherwise all reviewed and reported negative  Exam: General exam: Alert, awake, oriented x 3 Respiratory system: Clear to auscultation. Respiratory effort normal. Cardiovascular system:RRR. No murmurs, rubs, gallops. Gastrointestinal system: Abdomen is nondistended, soft and nontender. No organomegaly or masses felt. Normal bowel sounds heard.  Colostomy left lower quadrant containing stool and gas.  Midline incision Central nervous system: Alert and  oriented. No focal neurological deficits. Extremities: No C/C/E, +pedal pulses Skin: No rashes, lesions or ulcers Psychiatry: Judgement and insight appear normal. Mood & affect appropriate.    Basic Metabolic Panel: Recent Labs  Lab 06/29/18 0529 06/30/18 0457 07/01/18 0529 07/02/18 0427 07/03/18 0542  NA 140 138 140 142 137  K 4.4 3.8 3.6 3.7 3.7  CL 115* 114* 117* 118* 113*  CO2 19* 18* 17* 18* 19*  GLUCOSE 109* 90 83 96 151*  BUN 20 20 19 14 9   CREATININE 1.12 1.00 0.98 0.85 0.74  CALCIUM 6.7* 6.6* 7.1* 7.2* 7.4*  MG 1.5* 2.8* 2.5* 2.1  --    Liver Function Tests: Recent Labs  Lab 06/29/18 0529 06/30/18 0457 07/01/18 0529 07/02/18 0427 07/03/18 0542  AST 33 31 37 27 28  ALT 24 27 41 38 36  ALKPHOS 78 72 79 81 79  BILITOT 1.4* 1.2 1.4* 1.4* 1.7*  PROT 5.4* 5.2* 5.5* 5.5* 5.3*  ALBUMIN 2.4* 2.2* 2.3* 2.2* 2.3*   No results for input(s): LIPASE, AMYLASE in the last 168 hours. No results for input(s): AMMONIA in the last 168 hours. CBC: Recent Labs  Lab 06/29/18 0953 06/30/18 0457 06/30/18 2026 07/01/18 0529 07/02/18 0427 07/03/18 0542  WBC 46.4* 18.3*  --  18.1* 19.9* 18.4*  NEUTROABS 39.4* 15.4*  --  15.9* 15.1* 12.5*  HGB 7.6* 6.6* 8.8* 9.0* 9.6* 9.6*  HCT 24.6* 21.7* 28.5* 29.5* 31.1* 30.4*  MCV 108.8* 109.0*  --  102.1* 102.3* 100.3*  PLT 115* 82*  --  84* 80* 73*   Cardiac Enzymes: Recent Labs  Lab 06/28/18 1105  TROPONINI 0.03*   CBG (last 3)  No results for input(s): GLUCAP in the last 72 hours. Recent Results (from the past 240 hour(s))  Urine culture     Status: Abnormal   Collection Time: 06/28/18 10:38 AM  Result Value Ref [place] Status   Specimen Description   Final    URINE, RANDOM Performed at [facility], [address], [place], [place] [address]    Special Requests   Final    NONE Performed at [facility], [address], [place], [place] [address]    Culture (A)  Final    <10,000 COLONIES/mL INSIGNIFICANT GROWTH Performed  at [facility] [address], [place], [place] [address]    Report Status 06/30/2018 FINAL  Final  Culture, blood (Routine x 2)     Status: None   Collection Time: 06/28/18 10:51 AM  Result Value Ref Range Status   Specimen Description BLOOD RIGHT ARM  Final   Special Requests   Final    BOTTLES DRAWN AEROBIC AND ANAEROBIC Blood Culture results may not be optimal due to an inadequate volume of blood received in culture bottles   Culture   Final    NO GROWTH 5 DAYS Performed at [facility], [address] [place],  [address]    Report Status  07/03/2018 FINAL  Final  Culture, blood (Routine x 2)     Status: None   Collection Time: 06/28/18 10:55 AM  Result Value Ref [place] Status   Specimen Description BLOOD LEFT HAND  Final   Special Requests   Final    BOTTLES DRAWN AEROBIC AND ANAEROBIC Blood Culture adequate volume   Culture   Final    NO GROWTH 5 DAYS Performed at [facility], [address], [place], [place] [address]    Report Status 07/03/2018 FINAL  Final  MRSA PCR Screening     Status: None   Collection Time: 06/28/18  4:45 PM  Result Value Ref [place] Status   MRSA by PCR NEGATIVE NEGATIVE Final    Comment:        The GeneXpert MRSA Assay (FDA approved for NASAL specimens only), is one component of a comprehensive MRSA colonization surveillance program. It is not intended to diagnose MRSA infection nor to guide or monitor treatment for MRSA infections. Performed at [facility], [address], [place], [place] [address]   C difficile quick scan w PCR reflex     Status: None   Collection Time: 06/29/18  8:48 AM  Result Value Ref [place] Status   C Diff antigen NEGATIVE NEGATIVE Final   C Diff toxin NEGATIVE NEGATIVE Final   C Diff interpretation No C. difficile detected.  Final    Comment: Performed at [facility], [address], [place], [place] [address]  Aerobic Culture (superficial specimen)     Status: None   Collection Time: 06/29/18  9:58  AM  Result Value Ref Range Status   Specimen Description   Final    ABDOMEN Performed at [facility], [address], [place], [place] [address]    Special Requests   Final    NONE Performed at [facility], [address] [place], [place] [address]    Gram Stain   Final    RARE WBC PRESENT, PREDOMINANTLY PMN NO ORGANISMS SEEN    Culture   Final    NO GROWTH 3 DAYS Performed at [facility] [address] [place],  [address]    Report Status 07/03/2018 FINAL  Final     Studies: No results found. Scheduled Meds: . Chlorhexidine Gluconate Cloth  6 each Topical Daily  . docusate sodium  100 mg Oral BID  . finasteride  5 mg Oral Daily  . mouth rinse  15 mL Mouth Rinse BID  . sodium chloride flush  10-40 mL Intracatheter Q12H  . tamsulosin  0.4 mg Oral Daily   Continuous Infusions: . dextrose 5 % with KCl 20 mEq / L 20 mEq (07/03/18 0855)  . norepinephrine (LEVOPHED) Adult infusion Stopped (06/29/18 1848)  . piperacillin-tazobactam (ZOSYN)  IV 3.375 g (07/03/18 1415)  . vancomycin Stopped (07/03/18 1305)   Principal Problem:   Severe sepsis (HCC) Active Problems:   Fever   Anemia   Leukocytosis   BPH (benign prostatic hyperplasia)   Lactic acidosis   Cellulitis   Thrombocytopenia (HCC)   Colostomy necrosis (HCC)   Postoperative wound infection   Hypocalcemia   Hypokalemia   Hypomagnesemia   Metabolic acidosis  Time spent: 55mins  Kathie Dike, MD [facility] Pager [phone]  If 7PM-7AM, please contact night-coverage [email or web address] Password [facility] 07/03/2018, 6:27 PM    LOS: 5 days

## 2018-07-03 NOTE — Care Management Note (Addendum)
Case Management Note  Patient Details  Name: YEISON SIPPEL MRN: [id number] Date of Birth: [birth year]  Subjective/Objective:  Severe sepsis- (recently s/p colectomy with colostomy on 06/21/18 done at the [facility]). From home, independent. Active with [facility] for nursing and PT.                    Action/Plan: DC home with resumption of home health services. [facility] aware of admission.  Expected Discharge Date:    07/05/2018              Expected Discharge Plan:  [place]  In-House Referral:     Discharge planning Services  CM Consult  Post Acute Care Choice:  Home Health Choice offered to:  Patient  DME Arranged:    DME Agency:     HH Arranged:  PT, RN [place] Agency:  [facility] (now [facility])  Status of Service:  Completed, signed off  If discussed at [employer] of Stay Meetings, dates discussed:    Additional Comments:  Daiton Cowles, Chauncey [place], RN 07/03/2018, 8:26 AM

## 2018-07-04 ENCOUNTER — Inpatient Hospital Stay (HOSPITAL_COMMUNITY): Payer: Non-veteran care

## 2018-07-04 LAB — COMPREHENSIVE METABOLIC PANEL
ALT: 30 U/L (ref 0–44)
ANION GAP: 4 — AB (ref 5–15)
AST: 18 U/L (ref 15–41)
Albumin: 2.3 g/dL — ABNORMAL LOW (ref 3.5–5.0)
Alkaline Phosphatase: 79 U/L (ref 38–126)
BUN: 7 mg/dL — ABNORMAL LOW (ref 8–23)
CHLORIDE: 114 mmol/L — AB (ref 98–111)
CO2: 24 mmol/L (ref 22–32)
Calcium: 7.9 mg/dL — ABNORMAL LOW (ref 8.9–10.3)
Creatinine, Ser: 0.76 mg/dL (ref 0.61–1.24)
GFR calc Af Amer: >60 mL/min (ref >60)
GFR calc non Af Amer: >60 mL/min (ref >60)
Glucose, Bld: 123 mg/dL — ABNORMAL HIGH (ref 70–99)
POTASSIUM: 3.8 mmol/L (ref 3.5–5.1)
SODIUM: 142 mmol/L (ref 135–145)
Total Bilirubin: 1.3 mg/dL — ABNORMAL HIGH (ref 0.3–1.2)
Total Protein: 5.6 g/dL — ABNORMAL LOW (ref 6.5–8.1)

## 2018-07-04 LAB — CBC
HCT: 30.6 % — ABNORMAL LOW (ref 39.0–52.0)
Hemoglobin: 9.4 g/dL — ABNORMAL LOW (ref 13.0–17.0)
MCH: 31.1 pg (ref 26.0–34.0)
MCHC: 30.7 g/dL (ref 30.0–36.0)
MCV: 101.3 fL — ABNORMAL HIGH (ref 78.0–100.0)
PLATELETS: 68 10*3/uL — AB (ref 150–400)
RBC: 3.02 MIL/uL — ABNORMAL LOW (ref 4.22–5.81)
RDW: 22.6 % — AB (ref 11.5–15.5)
WBC: 14.7 10*3/uL — ABNORMAL HIGH (ref 4.0–10.5)

## 2018-07-04 NOTE — Progress Notes (Signed)
PROGRESS NOTE  Nicolas [name]  ELF:[id number]  DOB: 10-31-1936  DOA: 06/28/2018 PCP: Sharion Balloon, FNP  Brief Admission Hx: Nicolas [name] is a 82 y.o. male with colon cancer who is recently s/p colectomy with colostomy on 06/21/18 done at the [place] who presented to ED by EMS with complaints of fever up to 102 at home.  He was admitted with severe sepsis.   MDM/Assessment & Plan:  1. Severe Sepsis - Improving clinically.  Suspected from cellulitis near wound closure sites and ostomy necrosis.  CT abdomen did not show abscess.  Lactate is trending down now.   Blood cultures are shown no growth to date.  Currently on IV antibiotics.  Briefly required vasopressors, but has now been weaned off of pressors. Discussed with surgery.  2. Leukocytosis -WBC count ranging between 18-19, although this has trended down significantly since admission.  repeated CT abdomen with contrast 7/27 did show some gas around ostomy site that was explored by Dr. Constance Haw at bedside, no collection of fluid to suggest abscess.  No other obvious source of infection.  His bilirubin has been trending up over the past few days.  Right upper quadrant ultrasound does not show any acute findings.  If no significant findings, can consider discontinuation of intravenous antibiotics over the next 24 hours.  Continue to follow clinically.   3. Post op s/p colectomy with ostomy placement - Pt has some early ischemia/necrosis of ostomy but it has been functioning and the center remains pink and much healthier appearing.  Dr. Constance Haw with surgery is following 4. Hypotension -RESOLVED. Pt has a central line and had been on IV levophed to keep MAP>65.  He was taken off IV levophed 7/27.  Blood pressures have been stable.  Patient has peripheral access, will discontinue central line 5. Non-anion gap metabolic acidosis- likely secondary to hyperchloremia.  Patient had been receiving IV normal saline.  Fluids changed to dextrose with improvement of  bicarb.  Continue to follow electrolytes 6. Acute blood loss Anemia - Hg down to 6.6.  Transfused 2 units PRBCs 7/28.  Hg up to 9.6.  Follow.    7. BPH - resumed home medications, follow I/Os.    8. Hypokalemia / Hypomagnesemia -replaced. 9. Thrombocytopenia -appears to be a chronic issue.  Continue to monitor.  10. Lactic acidosis - Resolved.  Treated with IV fluid boluses per sepsis protocol and IV fluids.   DVT Prophylaxis: SCDs  Code Status: Full   Family Communication:  No family present Disposition Plan:  Discontinue telemetry.  Anticipate discharge in the next 24 hours if continues to improve  Antimicrobials:  unasyn 7/26-7/27  Zosyn 7/27>   Vancomycin 7/26>  Clindamycin 7/27-7/28  Subjective: Denies any abdominal pain.  No shortness of breath.  Feels well..  Objective: Vitals:   07/03/18 1934 07/03/18 2146 07/04/18 0522 07/04/18 1437  BP:  (!) 170/69 (!) 160/74 137/61  Pulse:  62 63 63  Resp:    18  Temp:  98.3 F (36.8 C) 98.2 F (36.8 C) 98.2 F (36.8 C)  TempSrc:  Oral Oral Oral  SpO2: 97% 94% 94% 95%  Weight:      Height:        Intake/Output Summary (Last 24 hours) at 07/04/2018 1727 Last data filed at 07/04/2018 1500 Gross per 24 hour  Intake 3339.59 ml  Output 2675 ml  Net 664.59 ml   Filed Weights   06/29/18 0415 07/02/18 0400 07/03/18 0400  Weight: 90.4 kg (199 lb  4.7 oz) 94.6 kg (208 lb 8.9 oz) 95.6 kg (210 lb 12.2 oz)   REVIEW OF SYSTEMS  As per history otherwise all reviewed and reported negative  Exam: General exam: Alert, awake, oriented x 3 Respiratory system: Clear to auscultation. Respiratory effort normal. Cardiovascular system:RRR. No murmurs, rubs, gallops. Gastrointestinal system: Abdomen is nondistended, soft and nontender. No organomegaly or masses felt. Normal bowel sounds heard.  Colostomy left lower quadrant with brown liquid stool.  Midline incision is packed. Central nervous system: Alert and oriented. No focal neurological  deficits. Extremities: No C/C/E, +pedal pulses Skin: No rashes, lesions or ulcers Psychiatry: Judgement and insight appear normal. Mood & affect appropriate.     Basic Metabolic Panel: Recent Labs  Lab 06/29/18 0529 06/30/18 0457 07/01/18 0529 07/02/18 0427 07/03/18 0542 07/04/18 0434  NA 140 138 140 142 137 142  K 4.4 3.8 3.6 3.7 3.7 3.8  CL 115* 114* 117* 118* 113* 114*  CO2 19* 18* 17* 18* 19* 24  GLUCOSE 109* 90 83 96 151* 123*  BUN 20 20 19 14 9  7*  CREATININE 1.12 1.00 0.98 0.85 0.74 0.76  CALCIUM 6.7* 6.6* 7.1* 7.2* 7.4* 7.9*  MG 1.5* 2.8* 2.5* 2.1  --   --    Liver Function Tests: Recent Labs  Lab 06/30/18 0457 07/01/18 0529 07/02/18 0427 07/03/18 0542 07/04/18 0434  AST 31 37 27 28 18   ALT 27 41 38 36 30  ALKPHOS 72 79 81 79 79  BILITOT 1.2 1.4* 1.4* 1.7* 1.3*  PROT 5.2* 5.5* 5.5* 5.3* 5.6*  ALBUMIN 2.2* 2.3* 2.2* 2.3* 2.3*   No results for input(s): LIPASE, AMYLASE in the last 168 hours. No results for input(s): AMMONIA in the last 168 hours. CBC: Recent Labs  Lab 06/29/18 0953 06/30/18 0457 06/30/18 2026 07/01/18 0529 07/02/18 0427 07/03/18 0542 07/04/18 0434  WBC 46.4* 18.3*  --  18.1* 19.9* 18.4* 14.7*  NEUTROABS 39.4* 15.4*  --  15.9* 15.1* 12.5*  --   HGB 7.6* 6.6* 8.8* 9.0* 9.6* 9.6* 9.4*  HCT 24.6* 21.7* 28.5* 29.5* 31.1* 30.4* 30.6*  MCV 108.8* 109.0*  --  102.1* 102.3* 100.3* 101.3*  PLT 115* 82*  --  84* 80* 73* 68*   Cardiac Enzymes: Recent Labs  Lab 06/28/18 1105  TROPONINI 0.03*   CBG (last 3)  No results for input(s): GLUCAP in the last 72 hours. Recent Results (from the past 240 hour(s))  Urine culture     Status: Abnormal   Collection Time: 06/28/18 10:38 AM  Result Value Ref Range Status   Specimen Description   Final    URINE, RANDOM Performed at [facility], [address] [place], [place] [address]    Special Requests   Final    NONE Performed at [facility], [address], [place], [place] [address]     Culture (A)  Final    <10,000 COLONIES/mL INSIGNIFICANT GROWTH Performed at [facility] [address], [place], [place] [address]    Report Status 06/30/2018 FINAL  Final  Culture, blood (Routine x 2)     Status: None   Collection Time: 06/28/18 10:51 AM  Result Value Ref Range Status   Specimen Description BLOOD RIGHT ARM  Final   Special Requests   Final    BOTTLES DRAWN AEROBIC AND ANAEROBIC Blood Culture results may not be optimal due to an inadequate volume of blood received in culture bottles   Culture   Final    NO GROWTH  5 DAYS Performed at [facility], [address] [place], [place] [address]    Report Status 07/03/2018 FINAL  Final  Culture, blood (Routine x 2)     Status: None   Collection Time: 06/28/18 10:55 AM  Result Value Ref Range Status   Specimen Description BLOOD LEFT HAND  Final   Special Requests   Final    BOTTLES DRAWN AEROBIC AND ANAEROBIC Blood Culture adequate volume   Culture   Final    NO GROWTH 5 DAYS Performed at [facility], [address] [place], [place] [address]    Report Status 07/03/2018 FINAL  Final  MRSA PCR Screening     Status: None   Collection Time: 06/28/18  4:45 PM  Result Value Ref Range Status   MRSA by PCR NEGATIVE NEGATIVE Final    Comment:        The GeneXpert MRSA Assay (FDA approved for NASAL specimens only), is one component of a comprehensive MRSA colonization surveillance program. It is not intended to diagnose MRSA infection nor to guide or monitor treatment for MRSA infections. Performed at [facility], [address] [place], [place] [address]   C difficile quick scan w PCR reflex     Status: None   Collection Time: 06/29/18  8:48 AM  Result Value Ref Range Status   C Diff antigen NEGATIVE NEGATIVE Final   C Diff toxin NEGATIVE NEGATIVE Final   C Diff interpretation No C. difficile detected.  Final    Comment: Performed at [facility], [address] [place], [place] [address]  Aerobic Culture  (superficial specimen)     Status: None   Collection Time: 06/29/18  9:58 AM  Result Value Ref Range Status   Specimen Description   Final    ABDOMEN Performed at [facility], [address] [place], [place] [address]    Special Requests   Final    NONE Performed at [facility], [address] [place], [place] [address]    Gram Stain   Final    RARE WBC PRESENT, PREDOMINANTLY PMN NO ORGANISMS SEEN    Culture   Final    NO GROWTH 3 DAYS Performed at [facility] [address], [place], [place] [address]    Report Status 07/03/2018 FINAL  Final     Studies: US Abdomen Limited Ruq  Result Date: 07/04/2018 CLINICAL DATA:  Hyperbilirubinemia. EXAM: ULTRASOUND ABDOMEN LIMITED RIGHT UPPER QUADRANT COMPARISON:  None. FINDINGS: Gallbladder: No gallstones or wall thickening visualized. No sonographic Murphy sign noted by sonographer. Common bile duct: Diameter: 3.9 mm which is within normal limits. Liver: No focal lesion identified. Within normal limits in parenchymal echogenicity. Portal vein is patent on color Doppler imaging with normal direction of blood flow towards the liver. IMPRESSION: No abnormality seen in the right upper quadrant of the abdomen. Electronically Signed   By: Marijo Conception, M.D.   On: 07/04/2018 09:36   Scheduled Meds: . Chlorhexidine Gluconate Cloth  6 each Topical Daily  . docusate sodium  100 mg Oral BID  . finasteride  5 mg Oral Daily  . mouth rinse  15 mL Mouth Rinse BID  . sodium chloride flush  10-40 mL Intracatheter Q12H  . tamsulosin  0.4 mg Oral Daily   Continuous Infusions: . piperacillin-tazobactam (ZOSYN)  IV Stopped (07/04/18 1721)   Principal Problem:   Severe sepsis (HCC) Active Problems:   Fever   Anemia   Leukocytosis   BPH (benign prostatic hyperplasia)  Lactic acidosis   Cellulitis   Thrombocytopenia (HCC)   Colostomy necrosis (HCC)   Postoperative wound infection   Hypocalcemia   Hypokalemia   Hypomagnesemia    Metabolic acidosis  Time spent: 25mins  Kathie Dike, MD [facility] Pager [phone]  If 7PM-7AM, please contact night-coverage [email or web address] Password [email or web address] 07/04/2018, 5:27 PM    LOS: 6 days

## 2018-07-04 NOTE — Progress Notes (Signed)
Physical Therapy Treatment Patient Details Name: Nicolas [name] MRN: [id number] DOB: 1936/03/08 Today's Date: 07/04/2018    History of Present Illness Nicolas [name] is a 82 y.o. male with colon cancer who is recently s/p colectomy with colostomy on 06/21/18 done at the [place] who presented to ED by EMS with complaints of fever up to 102 at home.  The says that he has had gradual rising in temperature since yesterday.  He reports no cough, no chest pain, no SOB, no palpitations, no neck or back pain and no vision changes.  He reports that his ostomy has been working and no blood seen in ostomy.  The patient says that he fell down a few days ago and bruised his left side but was seen by his PCP at [facility] and was told there were no serious injuries.      PT Comments    Patient present on room air with O2 saturation at 95-98% while completing BLE/ROM exercises seated at bedside, demonstrates increased endurance/distance for gait training without loss of balance, became mildly SOB requiring 1 standing rest break with O2 saturation between 89-92% while on room air during ambulation and O2 saturation recovered above 95% after resting in chair after gait training, patient left on room air - RN notified.  Patient will benefit from continued physical therapy in hospital and recommended venue below to increase strength, balance, endurance for safe ADLs and gait.    Follow Up Recommendations  Home health PT;Supervision for mobility/OOB     Equipment Recommendations  None recommended by PT    Recommendations for Other Services       Precautions / Restrictions Precautions Precautions: Fall Restrictions Weight Bearing Restrictions: No    Mobility  Bed Mobility Overal bed mobility: Modified Independent Bed Mobility: Supine to Sit     Supine to sit: Modified independent (Device/Increase time)     General bed mobility comments: head of bed raised approximately 30 degrees and use of side  rail  Transfers Overall transfer level: Needs assistance Equipment used: Rolling walker (2 wheeled) Transfers: Sit to/from [facility] Sit to Stand: Supervision Stand pivot transfers: Supervision       General transfer comment: requires less assistance  Ambulation/Gait Ambulation/Gait assistance: Supervision Gait Distance (Feet): 200 Feet Assistive device: Rolling walker (2 wheeled) Gait Pattern/deviations: Decreased step length - right;Decreased step length - left;Decreased stride length Gait velocity: slightly decreased   General Gait Details: demonstrates increased endurance/distance for gait training with slightly labored slow cadence without loss of balance, required standing rest break due to SOB while on room air with O2 saturation between 89-92%   Stairs             Wheelchair Mobility    Modified Rankin (Stroke Patients Only)       Balance Overall balance assessment: Needs assistance Sitting-balance support: Feet supported;No upper extremity supported Sitting balance-Leahy Scale: Good     Standing balance support: Bilateral upper extremity supported;During functional activity Standing balance-Leahy Scale: Fair Standing balance comment: using RW                            Cognition Arousal/Alertness: Awake/alert Behavior During Therapy: WFL for tasks assessed/performed Overall Cognitive Status: Within Functional Limits for tasks assessed  Exercises General Exercises - Lower Extremity Long Arc Quad: Seated;AROM;Strengthening;Both;10 reps Hip Flexion/Marching: Seated;AROM;Strengthening;Both;10 reps Toe Raises: Seated;AROM;Strengthening;Both;10 reps Heel Raises: Seated;AROM;Strengthening;Both;10 reps    General Comments        Pertinent Vitals/Pain Pain Assessment: No/denies pain    Home Living                      Prior Function            PT  Goals (current goals can now be found in the care plan section) Acute Rehab PT Goals Patient Stated Goal: return home Time For Goal Achievement: 07/18/18 Potential to Achieve Goals: Good Progress towards PT goals: Progressing toward goals    Frequency    Min 3X/week      PT Plan Current plan remains appropriate    Co-evaluation              AM-PAC PT "6 Clicks" Daily Activity  Outcome Measure  Difficulty turning over in bed (including adjusting bedclothes, sheets and blankets)?: None Difficulty moving from lying on back to sitting on the side of the bed? : A Little Difficulty sitting down on and standing up from a chair with arms (e.g., wheelchair, bedside commode, etc,.)?: None Help needed moving to and from a bed to chair (including a wheelchair)?: None Help needed walking in hospital room?: A Little Help needed climbing 3-5 steps with a railing? : A Little 6 Click Score: 21    End of Session   Activity Tolerance: Patient tolerated treatment well;Patient limited by fatigue Patient left: in chair;with call bell/phone within reach Nurse Communication: Mobility status;Other (comment)(nursing staff aware patient left up in chair) PT Visit Diagnosis: Unsteadiness on feet (R26.81);Other abnormalities of gait and mobility (R26.89);Muscle weakness (generalized) (M62.81)     Time: 2458-0998 PT Time Calculation (min) (ACUTE ONLY): 34 min  Charges:  $Gait Training: 8-22 mins $Therapeutic Exercise: 8-22 mins                     3:13 PM, 07/04/18 Lonell Grandchild, MPT Physical Therapist with [facility] [phone] office [phone] mobile phone

## 2018-07-04 NOTE — Care Management Note (Signed)
Case Management Note  Patient Details  Name: Nicolas [name] MRN: [id number] Date of Birth: 1935-12-11  If discussed at [place] Length of Stay Meetings, dates discussed:  07/04/2018  Additional Comments:  Sherald Barge, RN 07/04/2018, 12:13 PM

## 2018-07-05 DIAGNOSIS — D649 Anemia, unspecified: Secondary | ICD-10-CM

## 2018-07-05 LAB — COMPREHENSIVE METABOLIC PANEL
ALT: 25 U/L (ref 0–44)
AST: 18 U/L (ref 15–41)
Albumin: 2.4 g/dL — ABNORMAL LOW (ref 3.5–5.0)
Alkaline Phosphatase: 81 U/L (ref 38–126)
Anion gap: 5 (ref 5–15)
BUN: 7 mg/dL — ABNORMAL LOW (ref 8–23)
CALCIUM: 8 mg/dL — AB (ref 8.9–10.3)
CO2: 24 mmol/L (ref 22–32)
CREATININE: 0.7 mg/dL (ref 0.61–1.24)
Chloride: 111 mmol/L (ref 98–111)
GFR calc non Af Amer: >60 mL/min (ref >60)
GLUCOSE: 100 mg/dL — AB (ref 70–99)
Potassium: 3.7 mmol/L (ref 3.5–5.1)
SODIUM: 140 mmol/L (ref 135–145)
Total Bilirubin: 1.5 mg/dL — ABNORMAL HIGH (ref 0.3–1.2)
Total Protein: 5.6 g/dL — ABNORMAL LOW (ref 6.5–8.1)

## 2018-07-05 LAB — CBC
HCT: 30.4 % — ABNORMAL LOW (ref 39.0–52.0)
Hemoglobin: 9.6 g/dL — ABNORMAL LOW (ref 13.0–17.0)
MCH: 31.5 pg (ref 26.0–34.0)
MCHC: 31.6 g/dL (ref 30.0–36.0)
MCV: 99.7 fL (ref 78.0–100.0)
Platelets: 74 10*3/uL — ABNORMAL LOW (ref 150–400)
RBC: 3.05 MIL/uL — AB (ref 4.22–5.81)
RDW: 22.1 % — AB (ref 11.5–15.5)
WBC: 9.9 10*3/uL (ref 4.0–10.5)

## 2018-07-05 NOTE — Progress Notes (Signed)
[facility] Progress Note     Subjective: No major issues. Tolerating a diet. Having function in the ostomy. WBC down to normal.   Objective: Vital signs in last 24 hours: Temp:  [98.2 F (36.8 C)-98.3 F (36.8 C)] 98.3 F (36.8 C) (08/02 0610) Pulse Rate:  [58-63] 58 (08/02 0610) Resp:  [18] 18 (08/01 1437) BP: (137-166)/(61-86) 166/67 (08/02 0610) SpO2:  [93 %-96 %] 96 % (08/02 0610) Last BM Date: 07/05/18  Intake/Output from previous day: 08/01 0701 - 08/02 0700 In: 2131.1 [P.O.:720; I.V.:1090; IV Piggyback:321.1] Out: 1950 [Urine:1950] Intake/Output this shift: Total I/O In: -  Out: 625 [Urine:475; Stool:150]  General appearance: alert, cooperative and no distress Resp: normal work breathing GI: soft, nondistended, packing in the lower midline, ostomy with stool in bag, looks pinker, sloughing superficially the mucosa that was compromised  Lab Results:  Recent Labs    07/04/18 0434 07/05/18 0528  WBC 14.7* 9.9  HGB 9.4* 9.6*  HCT 30.6* 30.4*  PLT 68* 74*   BMET Recent Labs    07/04/18 0434 07/05/18 0528  NA 142 140  K 3.8 3.7  CL 114* 111  CO2 24 24  GLUCOSE 123* 100*  BUN 7* 7*  CREATININE 0.76 0.70  CALCIUM 7.9* 8.0*    Studies/Results: US Abdomen Limited Ruq  Result Date: 07/04/2018 CLINICAL DATA:  Hyperbilirubinemia. EXAM: ULTRASOUND ABDOMEN LIMITED RIGHT UPPER QUADRANT COMPARISON:  None. FINDINGS: Gallbladder: No gallstones or wall thickening visualized. No sonographic Murphy sign noted by sonographer. Common bile duct: Diameter: 3.9 mm which is within normal limits. Liver: No focal lesion identified. Within normal limits in parenchymal echogenicity. Portal vein is patent on color Doppler imaging with normal direction of blood flow towards the liver. IMPRESSION: No abnormality seen in the right upper quadrant of the abdomen. Electronically Signed   By: Marijo Conception, M.D.   On: 07/04/2018 09:36     Anti-infectives: Anti-infectives (From admission, onward)   Start     Dose/Rate Route Frequency Ordered Stop   06/29/18 1400  piperacillin-tazobactam (ZOSYN) IVPB 3.375 g     3.375 g 12.5 mL/hr over 240 Minutes Intravenous Every 8 hours 06/29/18 1140 07/05/18 0003   06/29/18 1400  clindamycin (CLEOCIN) IVPB 900 mg  Status:  Discontinued     900 mg 100 mL/hr over 30 Minutes Intravenous Every 8 hours 06/29/18 1343 06/30/18 1130   06/29/18 1330  clindamycin (CLEOCIN) IVPB 300 mg  Status:  Discontinued     300 mg 100 mL/hr over 30 Minutes Intravenous Every 6 hours 06/29/18 1326 06/29/18 1343   06/29/18 1200  vancomycin (VANCOCIN) IVPB 1000 mg/200 mL premix     1,000 mg 200 mL/hr over 60 Minutes Intravenous Every 12 hours 06/29/18 1140 07/04/18 1315   06/28/18 1800  Ampicillin-Sulbactam (UNASYN) 3 g in sodium chloride 0.9 % 100 mL IVPB  Status:  Discontinued     3 g 200 mL/hr over 30 Minutes Intravenous Every 6 hours 06/28/18 1432 06/29/18 1118   06/28/18 1100  vancomycin (VANCOCIN) 1,500 mg in sodium chloride 0.9 % 500 mL IVPB     1,500 mg 250 mL/hr over 120 Minutes Intravenous  Once 06/28/18 1047 06/28/18 1349   06/28/18 1045  piperacillin-tazobactam (ZOSYN) IVPB 3.375 g     3.375 g 100 mL/hr over 30 Minutes Intravenous  Once 06/28/18 1038 06/28/18 1210   06/28/18 1045  vancomycin (VANCOCIN) IVPB 1000 mg/200 mL premix  Status:  Discontinued     1,000 mg 200 mL/hr over  60 Minutes Intravenous  Once 06/28/18 1038 06/28/18 1047   06/28/18 0000  vancomycin (VANCOCIN) IVPB 1000 mg/200 mL premix  Status:  Discontinued     1,000 mg 200 mL/hr over 60 Minutes Intravenous Every 12 hours 06/28/18 1550 06/29/18 0846      Assessment/Plan: Mr. Snelgrove is an 82 yo with severe septic shock that presented to the hospital and is POD 16s/p partial colectomy with end colostomy for cancer at the [facility]. He is overall looking great. He is tolerating a diet and having ostomy function. His WBC has  normalized and has been on antibiotics for about 7 days since his admission. His ostomy is looking healthier.  -Diet as tolerated -BID packing to midline lower wound with damp to dry gauze -Do not have specific source that we were ever treating, but could have been the wound infection but no growth in the culture -Ostomy with some superficial necrosis of the mucosa was likely from hypotension related to this septic event -Have ruled out UTI, intraabdominal collections/ small collection at stump with dot of air, but all post operative, RUQ [place] negative for gallbladder pathology  -Can follow up with [place] [facility]      LOS: 7 days    Virl Cagey 07/05/2018

## 2018-07-05 NOTE — Progress Notes (Signed)
Pt discharged home today per Dr. Roderic Palau. Pt's IV site D/C'd and WDL. Pt's VSS. Pt provided with home medication list and discharge instructions. Verbalized understanding. Pt's colostomy and abdominal wound dressing changed prior to discharge. Wife and pt verbalized understanding. Pt left floor via WC in stable condition accompanied by NT.

## 2018-07-05 NOTE — Discharge Summary (Addendum)
Physician Discharge Summary  Nicolas [name] PJA:[id number] DOB: 01/07/1936 DOA: 06/28/2018  PCP: Sharion Balloon, FNP  Admit date: 06/28/2018 Discharge date: 07/05/2018  Admitted From: home Disposition:  home  Recommendations for Outpatient Follow-up:  1. Follow up with PCP in 1-2 weeks 2. Please obtain BMP/CBC in one week 3. Follow up with primary surgeon at the [facility] in the next 2 weeks  Home Health:HH PT, RN Equipment/Devices:  Discharge Condition:stable CODE STATUS:full code Diet recommendation: heart healthy  Brief/Interim Summary: Nicolas Dufresne Maysis a 82 y.o.malewith colon cancer who is recently s/p colectomy with colostomy on 06/21/18 done at the [place] who presented to ED by EMS with complaints of fever up to 102 at home. He was admitted with severe sepsis  Discharge Diagnoses:  Principal Problem:   Severe sepsis ([place]) Active Problems:   Fever   Anemia   Leukocytosis   BPH (benign prostatic hyperplasia)   Lactic acidosis   Cellulitis   Thrombocytopenia (HCC)   Colostomy necrosis (HCC)   Postoperative wound infection   Hypocalcemia   Hypokalemia   Hypomagnesemia   Metabolic acidosis  1. Septic shock.  Felt to be secondary to possible cellulitis near wound closure site.  Patient was hydrated with IV fluids and briefly required vasopressors.  Hemodynamics have since improved.  Sepsis has resolved.  Patient completed 7 days of IV antibiotics. Cultures have not shows any significant growth and he is not febrile. Will discontinue further antibiotics. For abdominal incision, recommendations for wound care are wet to dry dressing BID 2. Postop colectomy with ostomy placement.  This was performed at the [place] on 06/21/18.  He was followed by general surgery in the hospital since he did have some signs of early ischemia/necrosis of ostomy.  At this time, ostomy remains pink and healthy appearing and is functioning appropriately.  No further work-up per general surgery. 3. Acute blood loss  anemia. Hemoglobin trended down to 6.6. He was transfused 2 units prbc on 7/28. Hemoglobin improved to 9.6 and has remained stable. 4. BPH. Continued on flomax and finasteride 5. Thrombocytopenia. Appeared to be a chronic issue. Possibly exacerbated by acute illness. Platelets appear to be stabilizing 6. Lactic acidosis. Related to hypotension and sepsis. Resolved with hydration   Discharge Instructions  Discharge Instructions    Diet - low sodium heart healthy   Complete by:  As directed    Increase activity slowly   Complete by:  As directed      Allergies as of 07/05/2018      Reactions   Influenza Vaccines Nausea And Vomiting   Nausea and vomiting hours after flu shot two years in a row      Medication List    TAKE these medications   finasteride 5 MG tablet Commonly known as:  PROSCAR Take 5 mg by mouth daily.   tamsulosin 0.4 MG Caps capsule Commonly known as:  FLOMAX Take 0.4 mg by mouth daily.      Follow-up Information    follow up with your surgeon at [facility] in 2-3 weeks Follow up.          Allergies  Allergen Reactions  . Influenza Vaccines Nausea And Vomiting    Nausea and vomiting hours after flu shot two years in a row    Consultations:  General surgery   Procedures/Studies: Ct Abdomen Pelvis W Contrast  Result Date: 06/29/2018 CLINICAL DATA:  Increasing white blood cell count. Post recent colectomy and colostomy for colon cancer. EXAM: CT ABDOMEN AND PELVIS  WITH CONTRAST TECHNIQUE: Multidetector CT imaging of the abdomen and pelvis was performed using the standard protocol following bolus administration of intravenous contrast. CONTRAST:  82mL ISOVUE-300 IOPAMIDOL (ISOVUE-300) INJECTION 61%, 18mL ISOVUE-300 IOPAMIDOL (ISOVUE-300) INJECTION 61% COMPARISON:  06/28/2018 FINDINGS: Lower chest: Chronic right lower lobe volume loss and linear peribronchial consolidation. Small bilateral pleural effusions. 11 mm soft tissue nodule in the inferior aspect of the  right upper lobe, image 3/29, sequence 5, and image 74/107, sequence 6. Right hilar and subcarinal lymphadenopathy. Mild cardiomegaly. Calcific atherosclerotic disease of the coronary arteries, and aorta. Annular calcifications of the mitral and aortic valves. Hepatobiliary: Persistent peripheral hypoattenuated liver nodules, indeterminate. Normal appearance of the gallbladder. Pancreas: Unremarkable. No pancreatic ductal dilatation or surrounding inflammatory changes. Spleen: Spleen upper limits of normal at 11 cm. Adrenals/Urinary Tract: 15 mm nodule abuts the lateral leaflet of the right adrenal gland. Normal left adrenal gland. Mild bilateral perinephric stranding. Bilateral lower lobe renal cysts and other too small to be actually characterized hypoattenuated circumscribed nodules. No evidence of hydronephrosis or hydroureter. The bladder is hyperdense, probably due to residual contrast from yesterday's study. Decreasing amount of gas within the contralateral portion of the urinary bladder. Stomach/Bowel: Normal appearance of the stomach and small bowel. Post left colectomy. Again seen is thick-walled blind ending rectum in the pelvis, with a trace residual free gas and a small amount of free fluid in the pelvis. Left-sided colostomy with mildly edematous appearance of the external portion of the bowel and small amount of subcutaneous gas surrounding the stoma. Vascular/Lymphatic: Aortic atherosclerosis. No enlarged abdominal or pelvic lymph nodes. Reproductive: The prostate gland is enlarged to 5 cm. Other: Right inguinal hernia contains non incarcerated small bowel loops and small amount of free gas. The skin staples have been removed from the midline longitudinal abdominal incision, which is open superficially. Musculoskeletal: Spondylosis of the lumbosacral spine. Chronic lower posterior left rib fractures. No acute osseous abnormality. IMPRESSION: Postoperative changes of the abdomen from left colectomy.  Mildly edematous appearance of the exiting bowel at the left lower quadrant colostomy with small amount of subcutaneous gas surrounding the stoma. Please correlate to physical exam regarding peristomal infectious changes. Interval removal of skin staples with dehiscence of the superficial portion of the midline anterior abdominal wall surgical incision. Right inguinal hernia contains non incarcerated small bowel loops and small amount of free gas, presumably postsurgical. Small amount of free fluid and traces free gas in the pelvis, presumably postsurgical. 15 mm indeterminate exophytic right adrenal gland nodule. Attention on future follow-up is recommended. Indeterminate peripheral hypoattenuated liver nodules. 11 mm irregular soft tissue nodule in the inferior aspect of the right upper lobe. Consider one of the following in 3 months for both low-risk and high-risk individuals: (a) repeat chest CT, (b) follow-up PET-CT, or (c) tissue sampling. This recommendation follows the consensus statement: Guidelines for Management of Incidental Pulmonary Nodules Detected on CT Images: From the Fleischner Society 2017; Radiology 2017; 284:228-243. Right hilar and subcarinal lymphadenopathy. Persistent right lower lobe volume loss and peribronchial linear soft tissue thickening. Small bilateral pleural effusions. Electronically Signed   By: Fidela Salisbury M.D.   On: 06/29/2018 13:05   Ct Abdomen Pelvis W Contrast  Result Date: 06/28/2018 CLINICAL DATA:  82 year old male postoperative day 9 from colectomy and colostomy for colon cancer performed at the [place]. Fever, abdominal pain, tachycardia. EXAM: CT ABDOMEN AND PELVIS WITH CONTRAST TECHNIQUE: Multidetector CT imaging of the abdomen and pelvis was performed using the standard protocol following bolus administration  of intravenous contrast. CONTRAST:  152mL ISOVUE-300 IOPAMIDOL (ISOVUE-300) INJECTION 61% COMPARISON:  [facility] CT Abdomen and Pelvis 02/19/2014  FINDINGS: Lower chest: Chronic right lower lobe scarring and fibrothorax is stable since 2015. Resolved small left pleural effusion since that time. Mild cardiomegaly is increased. No pericardial effusion. Calcified coronary artery atherosclerosis is evident. Hepatobiliary: Stable 15 millimeter hypodense area along the anterior right liver dome since 2015, benign. Stable similar small peripheral hypodense areas along the falciform ligament and anterior right lobe (images 25 and 36, respectively). Negative gallbladder. Pancreas: Negative. Spleen: Mild splenomegaly, otherwise negative. Adrenals/Urinary Tract: Small intermediate density right adrenal nodule measuring 10 millimeters is indeterminate and was not definitely present in 2015. The left adrenal appears normal. Mild bilateral nonspecific perinephric stranding. Symmetric renal contrast enhancement and excretion. Decompressed proximal ureters. Diminutive urinary bladder containing non dependent gas (series 2, image 81). No perivesical stranding. Stomach/Bowel: There is what appears to be a thick-walled blind-ending rectum in the pelvis. There is trace adjacent pneumoperitoneum (series 2, image 81) and a small volume of free fluid. The rectal stump is difficult to differentiate from nondilated small bowel in the pelvis. There is a left side descending colostomy with no adverse features. The upstream large bowel is decompressed. The transverse colon is redundant. Normal appendix. Motion artifact in the lower abdomen. Decompressed terminal ileum. Decompressed distal small bowel is partially within the small or developing right inguinal hernia as demonstrated on coronal image 30. Similar but less pronounced bulge at the left inguinal ring with adjacent small bowel. The stomach and duodenum are decompressed. The proximal jejunum is mildly dilated with gas and fluid measuring 3 centimeters diameter. There is a gradual transition to decompressed mid and distal small  bowel. No abdominal free fluid. Vascular/Lymphatic: Aortoiliac calcified atherosclerosis. The major arterial structures in the abdomen and pelvis are patent. Portal venous system is patent. No lymphadenopathy. Reproductive: Marginal right inguinal hernia which contains a small volume of pneumoperitoneum (series 2, image 81). Other: Small volume pelvic free fluid.  Mild presacral stranding. Postoperative changes to the ventral abdominal wall with stranding. Trace fluid subjacent to the skin staples in the midline. No associated rim enhancement. The Musculoskeletal: Chronic lower posterior left rib fractures. Flowing osteophytes in the spine. Chronic L5-S1 ankylosis. No acute osseous abnormality identified. IMPRESSION: 1. Postoperative changes to the abdomen with descending colostomy and what appears to be a blind ending rectum. Mild rectal wall thickening, trace pneumoperitoneum and free fluid appear within normal limits for the postoperative state. 2. Proximal small bowel ileus suspected. No evidence of obstruction at the colostomy. Small bilateral inguinal hernias do marginally involve the distal small bowel, but do not appear consequential at this time. 3. Postoperative changes to the ventral abdomen likewise appear within normal limits. 4. Small indeterminate 10 mm right adrenal nodule. Small chronic low-density areas in the liver are stable since 2015 and benign. 5. Chronic right lung base fibrothorax. Aortic Atherosclerosis (ICD10-I70.0). Electronically Signed   By: Genevie Ann M.D.   On: 06/28/2018 13:00   Dg Chest Port 1 View  Result Date: 07/01/2018 CLINICAL DATA:  82 year old male with a history of colon cancer and sepsis EXAM: PORTABLE CHEST 1 VIEW COMPARISON:  06/29/2018, 06/28/2018, 06/28/2018 FINDINGS: Cardiomediastinal silhouette unchanged in size and contour. Increasing opacity at the right lung base with obscuration of the right hemidiaphragm and right heart border. Patchy opacities at the left lung  base. No pneumothorax. No interlobular septal thickening. Unchanged position of left IJ central venous catheter with the  tip appearing to terminate superior vena cava. IMPRESSION: Increasing opacity at the right lung base, potentially combination of pleural fluid, atelectasis/consolidation, and/or edema. Chronic lung changes. Unchanged left IJ central venous catheter. Electronically Signed   By: Corrie Mckusick D.O.   On: 07/01/2018 07:27   Portable Chest 1 View  Result Date: 06/29/2018 CLINICAL DATA:  History of sepsis EXAM: PORTABLE CHEST 1 VIEW COMPARISON:  06/28/2017 FINDINGS: Left jugular central line is again noted in the mid superior vena cava. Cardiac shadow remains mildly enlarged. Elevation the right hemidiaphragm is seen. Mild right basilar atelectasis is noted. No effusion or pneumothorax is seen. IMPRESSION: Mild right basilar atelectasis. Electronically Signed   By: Inez Catalina M.D.   On: 06/29/2018 08:29   Dg Chest Portable 1 View  Result Date: 06/28/2018 CLINICAL DATA:  Central line placement. EXAM: PORTABLE CHEST 1 VIEW COMPARISON:  06/28/2018 and prior chest radiographs FINDINGS: A LEFT IJ central venous catheter is noted with tip overlying the mid SVC. Cardiomegaly and mild pulmonary vascular congestion noted. Mild RIGHT basilar atelectasis noted. No pleural effusion or pneumothorax. No significant change otherwise. IMPRESSION: LEFT IJ central venous catheter with tip overlying the mid SVC. No pneumothorax. Cardiomegaly, mild pulmonary vascular congestion and RIGHT basilar atelectasis. Electronically Signed   By: Margarette Canada M.D.   On: 06/28/2018 16:15   Dg Chest Port 1 View  Result Date: 06/28/2018 CLINICAL DATA:  Fever.  History of colon carcinoma EXAM: PORTABLE CHEST 1 VIEW COMPARISON:  February 20, 2014 FINDINGS: There is atelectatic change in the right base. Lungs elsewhere are clear. Heart is borderline prominent with pulmonary vascularity normal. No adenopathy. There is aortic  atherosclerosis. There is degenerative change in each shoulder. IMPRESSION: Right base atelectasis. No edema or consolidation. Stable cardiac prominence. There is aortic atherosclerosis. Aortic Atherosclerosis (ICD10-I70.0). Electronically Signed   By: Lowella Grip III M.D.   On: 06/28/2018 11:13   US Abdomen Limited Ruq  Result Date: 07/04/2018 CLINICAL DATA:  Hyperbilirubinemia. EXAM: ULTRASOUND ABDOMEN LIMITED RIGHT UPPER QUADRANT COMPARISON:  None. FINDINGS: Gallbladder: No gallstones or wall thickening visualized. No sonographic Murphy sign noted by sonographer. Common bile duct: Diameter: 3.9 mm which is within normal limits. Liver: No focal lesion identified. Within normal limits in parenchymal echogenicity. Portal vein is patent on color Doppler imaging with normal direction of blood flow towards the liver. IMPRESSION: No abnormality seen in the right upper quadrant of the abdomen. Electronically Signed   By: Marijo Conception, M.D.   On: 07/04/2018 09:36       Subjective: Patient denies any abdominal pain. No vomiting, no shortness of breath  Discharge Exam: Vitals:   07/04/18 2116 07/05/18 0610  BP: (!) 159/86 (!) 166/67  Pulse: 61 (!) 58  Resp:    Temp: 98.3 F (36.8 C) 98.3 F (36.8 C)  SpO2: 93% 96%   Vitals:   07/04/18 0522 07/04/18 1437 07/04/18 2116 07/05/18 0610  BP: (!) 160/74 137/61 (!) 159/86 (!) 166/67  Pulse: 63 63 61 (!) 58  Resp:  18    Temp: 98.2 F (36.8 C) 98.2 F (36.8 C) 98.3 F (36.8 C) 98.3 F (36.8 C)  TempSrc: Oral Oral Oral Oral  SpO2: 94% 95% 93% 96%  Weight:      Height:        General: Pt is alert, awake, not in acute distress Cardiovascular: RRR, S1/S2 +, no rubs, no gallops Respiratory: CTA bilaterally, no wheezing, no rhonchi Abdominal: Soft, NT, ND, bowel sounds +, colostomy  LLQ, midline incision is packed. Extremities: no edema, no cyanosis    The results of significant diagnostics from this hospitalization (including  imaging, microbiology, ancillary and laboratory) are listed below for reference.     Microbiology: Recent Results (from the past 240 hour(s))  Urine culture     Status: Abnormal   Collection Time: 06/28/18 10:38 AM  Result Value Ref Range Status   Specimen Description   Final    URINE, RANDOM Performed at [facility], [address], [place], [place] [address]    Special Requests   Final    NONE Performed at [facility], [address] [place], [place] [address]    Culture (A)  Final    <10,000 COLONIES/mL INSIGNIFICANT GROWTH Performed at [facility] [address], [place], [place] [address]    Report Status 06/30/2018 FINAL  Final  Culture, blood (Routine x 2)     Status: None   Collection Time: 06/28/18 10:51 AM  Result Value Ref Range Status   Specimen Description BLOOD RIGHT ARM  Final   Special Requests   Final    BOTTLES DRAWN AEROBIC AND ANAEROBIC Blood Culture results may not be optimal due to an inadequate volume of blood received in culture bottles   Culture   Final    NO GROWTH 5 DAYS Performed at [facility], [address], [place], [place] [address]    Report Status 07/03/2018 FINAL  Final  Culture, blood (Routine x 2)     Status: None   Collection Time: 06/28/18 10:55 AM  Result Value Ref Range Status   Specimen Description BLOOD LEFT HAND  Final   Special Requests   Final    BOTTLES DRAWN AEROBIC AND ANAEROBIC Blood Culture adequate volume   Culture   Final    NO GROWTH 5 DAYS Performed at [facility], [address], [place], [place] [address]    Report Status 07/03/2018 FINAL  Final  MRSA PCR Screening     Status: None   Collection Time: 06/28/18  4:45 PM  Result Value Ref Range Status   MRSA by PCR NEGATIVE NEGATIVE Final    Comment:        The GeneXpert MRSA Assay (FDA approved for NASAL specimens only), is one component of a comprehensive MRSA colonization surveillance program. It is not intended to diagnose MRSA infection nor to  guide or monitor treatment for MRSA infections. Performed at [facility], [address] [place], [place] [address]   C difficile quick scan w PCR reflex     Status: None   Collection Time: 06/29/18  8:48 AM  Result Value Ref Range Status   C Diff antigen NEGATIVE NEGATIVE Final   C Diff toxin NEGATIVE NEGATIVE Final   C Diff interpretation No C. difficile detected.  Final    Comment: Performed at [facility], [address] [place], [place] [address]  Aerobic Culture (superficial specimen)     Status: None   Collection Time: 06/29/18  9:58 AM  Result Value Ref Range Status   Specimen Description   Final    ABDOMEN Performed at [facility], [address] [place], [place] [address]    Special Requests   Final    NONE Performed at [facility], [address] [place], [place] [address]    Gram Stain   Final    RARE WBC PRESENT, PREDOMINANTLY PMN NO ORGANISMS SEEN    Culture   Final    NO GROWTH 3  DAYS Performed at [facility], [place] [address], [place], [place] [address]    Report Status 07/03/2018 FINAL  Final     Labs: BNP (last 3 results) No results for input(s): BNP in the last 8760 hours. Basic Metabolic Panel: Recent Labs  Lab 06/29/18 0529 06/30/18 0457 07/01/18 0529 07/02/18 0427 07/03/18 0542 07/04/18 0434 07/05/18 0528  NA 140 138 140 142 137 142 140  K 4.4 3.8 3.6 3.7 3.7 3.8 3.7  CL 115* 114* 117* 118* 113* 114* 111  CO2 19* 18* 17* 18* 19* 24 24  GLUCOSE 109* 90 83 96 151* 123* 100*  BUN 20 20 19 14 9  7* 7*  CREATININE 1.12 1.00 0.98 0.85 0.74 0.76 0.70  CALCIUM 6.7* 6.6* 7.1* 7.2* 7.4* 7.9* 8.0*  MG 1.5* 2.8* 2.5* 2.1  --   --   --    Liver Function Tests: Recent Labs  Lab 07/01/18 0529 07/02/18 0427 07/03/18 0542 07/04/18 0434 07/05/18 0528  AST 37 27 28 18 18   ALT 41 38 36 30 25  ALKPHOS 79 81 79 79 81  BILITOT 1.4* 1.4* 1.7* 1.3* 1.5*  PROT 5.5* 5.5* 5.3* 5.6* 5.6*  ALBUMIN 2.3* 2.2* 2.3* 2.3* 2.4*   No results for  input(s): LIPASE, AMYLASE in the last 168 hours. No results for input(s): AMMONIA in the last 168 hours. CBC: Recent Labs  Lab 06/29/18 0953 06/30/18 0457  07/01/18 0529 07/02/18 0427 07/03/18 0542 07/04/18 0434 07/05/18 0528  WBC 46.4* 18.3*  --  18.1* 19.9* 18.4* 14.7* 9.9  NEUTROABS 39.4* 15.4*  --  15.9* 15.1* 12.5*  --   --   HGB 7.6* 6.6*   < > 9.0* 9.6* 9.6* 9.4* 9.6*  HCT 24.6* 21.7*   < > 29.5* 31.1* 30.4* 30.6* 30.4*  MCV 108.8* 109.0*  --  102.1* 102.3* 100.3* 101.3* 99.7  PLT 115* 82*  --  84* 80* 73* 68* 74*   < > = values in this interval not displayed.   Cardiac Enzymes: No results for input(s): CKTOTAL, CKMB, CKMBINDEX, TROPONINI in the last 168 hours. BNP: Invalid input(s): POCBNP CBG: No results for input(s): GLUCAP in the last 168 hours. D-Dimer No results for input(s): DDIMER in the last 72 hours. Hgb A1c No results for input(s): HGBA1C in the last 72 hours. Lipid Profile No results for input(s): CHOL, HDL, LDLCALC, TRIG, CHOLHDL, LDLDIRECT in the last 72 hours. Thyroid function studies No results for input(s): TSH, T4TOTAL, T3FREE, THYROIDAB in the last 72 hours.  Invalid input(s): FREET3 Anemia work up No results for input(s): VITAMINB12, FOLATE, FERRITIN, TIBC, IRON, RETICCTPCT in the last 72 hours. Urinalysis    Component Value Date/Time   COLORURINE YELLOW 06/28/2018 [place] 06/28/2018 1045   LABSPEC 1.014 06/28/2018 1045   PHURINE 5.0 06/28/2018 1045   GLUCOSEU NEGATIVE 06/28/2018 1045   HGBUR SMALL (A) 06/28/2018 [place] 06/28/2018 [place] 06/28/2018 1045   PROTEINUR NEGATIVE 06/28/2018 1045   UROBILINOGEN 1.0 02/20/2014 0201   NITRITE NEGATIVE 06/28/2018 1045   LEUKOCYTESUR NEGATIVE 06/28/2018 1045   Sepsis Labs Invalid input(s): PROCALCITONIN,  WBC,  LACTICIDVEN Microbiology Recent Results (from the past 240 hour(s))  Urine culture     Status: Abnormal   Collection Time:  06/28/18 10:38 AM  Result Value Ref Range Status   Specimen Description   Final    URINE, RANDOM Performed at [facility], [address] [place], [place] [address]  Special Requests   Final    NONE Performed at [facility], [address], [place], [place] [address]    Culture (A)  Final    <10,000 COLONIES/mL INSIGNIFICANT GROWTH Performed at [facility] [address] [place], [place] [address]    Report Status 06/30/2018 FINAL  Final  Culture, blood (Routine x 2)     Status: None   Collection Time: 06/28/18 10:51 AM  Result Value Ref Range Status   Specimen Description BLOOD RIGHT ARM  Final   Special Requests   Final    BOTTLES DRAWN AEROBIC AND ANAEROBIC Blood Culture results may not be optimal due to an inadequate volume of blood received in culture bottles   Culture   Final    NO GROWTH 5 DAYS Performed at [facility], [address], [place], [place] [address]    Report Status 07/03/2018 FINAL  Final  Culture, blood (Routine x 2)     Status: None   Collection Time: 06/28/18 10:55 AM  Result Value Ref Range Status   Specimen Description BLOOD LEFT HAND  Final   Special Requests   Final    BOTTLES DRAWN AEROBIC AND ANAEROBIC Blood Culture adequate volume   Culture   Final    NO GROWTH 5 DAYS Performed at [facility], [address] [place], [place] [address]    Report Status 07/03/2018 FINAL  Final  MRSA PCR Screening     Status: None   Collection Time: 06/28/18  4:45 PM  Result Value Ref Range Status   MRSA by PCR NEGATIVE NEGATIVE Final    Comment:        The GeneXpert MRSA Assay (FDA approved for NASAL specimens only), is one component of a comprehensive MRSA colonization surveillance program. It is not intended to diagnose MRSA infection nor to guide or monitor treatment for MRSA infections. Performed at [facility], [address], [place], [place] [address]   C difficile quick scan w PCR reflex     Status: None   Collection Time:  06/29/18  8:48 AM  Result Value Ref Range Status   C Diff antigen NEGATIVE NEGATIVE Final   C Diff toxin NEGATIVE NEGATIVE Final   C Diff interpretation No C. difficile detected.  Final    Comment: Performed at [facility], [address], [place], [place] [address]  Aerobic Culture (superficial specimen)     Status: None   Collection Time: 06/29/18  9:58 AM  Result Value Ref Range Status   Specimen Description   Final    ABDOMEN Performed at [facility], [address] [place], [place] [address]    Special Requests   Final    NONE Performed at [facility], [address] [place], [place] [address]    Gram Stain   Final    RARE WBC PRESENT, PREDOMINANTLY PMN NO ORGANISMS SEEN    Culture   Final    NO GROWTH 3 DAYS Performed at [facility] [address], [place],  [address]    Report Status 07/03/2018 FINAL  Final     Time coordinating discharge: 31mins  SIGNED:   Kathie Dike, MD  [facility] 07/05/2018, 11:42 AM Pager   If 7PM-7AM, please contact night-coverage [email or web address] Password [email or web address]

## 2018-07-05 NOTE — Care Management Important Message (Signed)
Important Message  Patient Details  Name: Nicolas [name] MRN: [id number] Date of Birth: [birth year]   Medicare Important Message Given:  Yes    Shelda Altes 07/05/2018, 12:41 PM

## 2018-07-05 NOTE — Care Management (Signed)
[facility], Nicolas [name], aware of DC today. HH orders in chart. Pt aware HH has 48 hrs to make first visit after DC.

## 2018-07-06 DIAGNOSIS — J439 Emphysema, unspecified: Secondary | ICD-10-CM | POA: Diagnosis not present

## 2018-07-06 DIAGNOSIS — Z8521 Personal history of malignant neoplasm of larynx: Secondary | ICD-10-CM | POA: Diagnosis not present

## 2018-07-06 DIAGNOSIS — C19 Malignant neoplasm of rectosigmoid junction: Secondary | ICD-10-CM | POA: Diagnosis not present

## 2018-07-06 DIAGNOSIS — Z433 Encounter for attention to colostomy: Secondary | ICD-10-CM | POA: Diagnosis not present

## 2018-07-06 DIAGNOSIS — Z87891 Personal history of nicotine dependence: Secondary | ICD-10-CM | POA: Diagnosis not present

## 2018-07-06 DIAGNOSIS — Z9181 History of falling: Secondary | ICD-10-CM | POA: Diagnosis not present

## 2018-07-06 DIAGNOSIS — H3530 Unspecified macular degeneration: Secondary | ICD-10-CM | POA: Diagnosis not present

## 2018-07-06 DIAGNOSIS — D469 Myelodysplastic syndrome, unspecified: Secondary | ICD-10-CM | POA: Diagnosis not present

## 2018-07-08 DIAGNOSIS — H3530 Unspecified macular degeneration: Secondary | ICD-10-CM | POA: Diagnosis not present

## 2018-07-08 DIAGNOSIS — Z87891 Personal history of nicotine dependence: Secondary | ICD-10-CM | POA: Diagnosis not present

## 2018-07-08 DIAGNOSIS — C19 Malignant neoplasm of rectosigmoid junction: Secondary | ICD-10-CM | POA: Diagnosis not present

## 2018-07-08 DIAGNOSIS — Z433 Encounter for attention to colostomy: Secondary | ICD-10-CM | POA: Diagnosis not present

## 2018-07-08 DIAGNOSIS — J439 Emphysema, unspecified: Secondary | ICD-10-CM | POA: Diagnosis not present

## 2018-07-08 DIAGNOSIS — D469 Myelodysplastic syndrome, unspecified: Secondary | ICD-10-CM | POA: Diagnosis not present

## 2018-07-08 DIAGNOSIS — Z9181 History of falling: Secondary | ICD-10-CM | POA: Diagnosis not present

## 2018-07-08 DIAGNOSIS — Z8521 Personal history of malignant neoplasm of larynx: Secondary | ICD-10-CM | POA: Diagnosis not present

## 2018-07-10 ENCOUNTER — Other Ambulatory Visit: Payer: Self-pay | Admitting: *Deleted

## 2018-07-10 NOTE — Patient Outreach (Signed)
Nicolas [name] [facility]) Care Management  07/10/2018  Nicolas [name] 07/24/1936 [id number]  Referral via RED Alert-EMMI-General Discharge-Day#7. 07/07/2018: Reason: scheduled follow up-no  Telephone call #1 to patient who was advised of reason for call. HIPPA verification received.   Patient voices that he had follow up with surgeon at [facility] in [place], [place] yesterday 07/09/2018.  States she has not made appointment with primary care provider. Spouse states he has only seen provider at [facility] once and actually goes  to [place] to see MD. States he has another appointment with MD at [facility] coming up. Advised spouse importance of having primary care provider and importance of follow up after hospital stay. Contact information was offered to spouse. States she has information and voices understanding. Advised spouse to take list of medications and discharge instructions to follow up appointment with patient.  States daughter takes pt to appointments. Spouse states she changes dressing to wound once daily. States she knows to call MD if area looks infected or if patient has fever.  Voices patient does have home health services.  Voices no further concerns. EMMI-Red Alert has been addressed.   Sherrin Daisy, RN BSN [place] Management Coordinator [facility] Care Management  [phone]

## 2018-07-11 ENCOUNTER — Other Ambulatory Visit: Payer: Self-pay | Admitting: *Deleted

## 2018-07-11 DIAGNOSIS — H3530 Unspecified macular degeneration: Secondary | ICD-10-CM | POA: Diagnosis not present

## 2018-07-11 DIAGNOSIS — C19 Malignant neoplasm of rectosigmoid junction: Secondary | ICD-10-CM | POA: Diagnosis not present

## 2018-07-11 DIAGNOSIS — J439 Emphysema, unspecified: Secondary | ICD-10-CM | POA: Diagnosis not present

## 2018-07-11 DIAGNOSIS — Z8521 Personal history of malignant neoplasm of larynx: Secondary | ICD-10-CM | POA: Diagnosis not present

## 2018-07-11 DIAGNOSIS — Z87891 Personal history of nicotine dependence: Secondary | ICD-10-CM | POA: Diagnosis not present

## 2018-07-11 DIAGNOSIS — Z433 Encounter for attention to colostomy: Secondary | ICD-10-CM | POA: Diagnosis not present

## 2018-07-11 DIAGNOSIS — Z9181 History of falling: Secondary | ICD-10-CM | POA: Diagnosis not present

## 2018-07-11 DIAGNOSIS — D469 Myelodysplastic syndrome, unspecified: Secondary | ICD-10-CM | POA: Diagnosis not present

## 2018-07-11 NOTE — Patient Outreach (Signed)
[facility]) Care Management  07/11/2018  JOHNATHEN TESTA 1936-02-25 [id number]  Referral via RED Alert-EMMI-General Discharge Day# 4., 07/10/2018: Reason: Lost interest in things-yes  Telephone call to patient who was advised of reason for call. [place] verification received.  Patient voices that he is not depressed and has not lost interest in doing things. States he has people to talk to if he feels down. States he has visits from church members since be came ill.  States wife offers support and is caregiver.   States no further concerns. EMMI-Red Alert has been addressed.  Plan: Case closure.   Sherrin Daisy, RN BSN [place] Management Coordinator [facility] Care Management  [phone]

## 2018-07-12 DIAGNOSIS — H3530 Unspecified macular degeneration: Secondary | ICD-10-CM | POA: Diagnosis not present

## 2018-07-12 DIAGNOSIS — Z87891 Personal history of nicotine dependence: Secondary | ICD-10-CM | POA: Diagnosis not present

## 2018-07-12 DIAGNOSIS — Z9181 History of falling: Secondary | ICD-10-CM | POA: Diagnosis not present

## 2018-07-12 DIAGNOSIS — J439 Emphysema, unspecified: Secondary | ICD-10-CM | POA: Diagnosis not present

## 2018-07-12 DIAGNOSIS — C19 Malignant neoplasm of rectosigmoid junction: Secondary | ICD-10-CM | POA: Diagnosis not present

## 2018-07-12 DIAGNOSIS — Z433 Encounter for attention to colostomy: Secondary | ICD-10-CM | POA: Diagnosis not present

## 2018-07-12 DIAGNOSIS — D469 Myelodysplastic syndrome, unspecified: Secondary | ICD-10-CM | POA: Diagnosis not present

## 2018-07-12 DIAGNOSIS — Z8521 Personal history of malignant neoplasm of larynx: Secondary | ICD-10-CM | POA: Diagnosis not present

## 2018-07-16 DIAGNOSIS — D469 Myelodysplastic syndrome, unspecified: Secondary | ICD-10-CM | POA: Diagnosis not present

## 2018-07-16 DIAGNOSIS — Z9181 History of falling: Secondary | ICD-10-CM | POA: Diagnosis not present

## 2018-07-16 DIAGNOSIS — H3530 Unspecified macular degeneration: Secondary | ICD-10-CM | POA: Diagnosis not present

## 2018-07-16 DIAGNOSIS — Z87891 Personal history of nicotine dependence: Secondary | ICD-10-CM | POA: Diagnosis not present

## 2018-07-16 DIAGNOSIS — Z433 Encounter for attention to colostomy: Secondary | ICD-10-CM | POA: Diagnosis not present

## 2018-07-16 DIAGNOSIS — J439 Emphysema, unspecified: Secondary | ICD-10-CM | POA: Diagnosis not present

## 2018-07-16 DIAGNOSIS — C19 Malignant neoplasm of rectosigmoid junction: Secondary | ICD-10-CM | POA: Diagnosis not present

## 2018-07-16 DIAGNOSIS — Z8521 Personal history of malignant neoplasm of larynx: Secondary | ICD-10-CM | POA: Diagnosis not present

## 2018-07-17 ENCOUNTER — Telehealth: Payer: Self-pay | Admitting: Family

## 2018-07-19 DIAGNOSIS — C19 Malignant neoplasm of rectosigmoid junction: Secondary | ICD-10-CM | POA: Diagnosis not present

## 2018-07-19 DIAGNOSIS — Z9181 History of falling: Secondary | ICD-10-CM | POA: Diagnosis not present

## 2018-07-19 DIAGNOSIS — Z8521 Personal history of malignant neoplasm of larynx: Secondary | ICD-10-CM | POA: Diagnosis not present

## 2018-07-19 DIAGNOSIS — Z433 Encounter for attention to colostomy: Secondary | ICD-10-CM | POA: Diagnosis not present

## 2018-07-19 DIAGNOSIS — Z87891 Personal history of nicotine dependence: Secondary | ICD-10-CM | POA: Diagnosis not present

## 2018-07-19 DIAGNOSIS — H3530 Unspecified macular degeneration: Secondary | ICD-10-CM | POA: Diagnosis not present

## 2018-07-19 DIAGNOSIS — J439 Emphysema, unspecified: Secondary | ICD-10-CM | POA: Diagnosis not present

## 2018-07-19 DIAGNOSIS — D469 Myelodysplastic syndrome, unspecified: Secondary | ICD-10-CM | POA: Diagnosis not present

## 2018-07-23 DIAGNOSIS — Z87891 Personal history of nicotine dependence: Secondary | ICD-10-CM | POA: Diagnosis not present

## 2018-07-23 DIAGNOSIS — Z8521 Personal history of malignant neoplasm of larynx: Secondary | ICD-10-CM | POA: Diagnosis not present

## 2018-07-23 DIAGNOSIS — Z9181 History of falling: Secondary | ICD-10-CM | POA: Diagnosis not present

## 2018-07-23 DIAGNOSIS — J439 Emphysema, unspecified: Secondary | ICD-10-CM | POA: Diagnosis not present

## 2018-07-23 DIAGNOSIS — H3530 Unspecified macular degeneration: Secondary | ICD-10-CM | POA: Diagnosis not present

## 2018-07-23 DIAGNOSIS — Z433 Encounter for attention to colostomy: Secondary | ICD-10-CM | POA: Diagnosis not present

## 2018-07-23 DIAGNOSIS — D469 Myelodysplastic syndrome, unspecified: Secondary | ICD-10-CM | POA: Diagnosis not present

## 2018-07-23 DIAGNOSIS — C19 Malignant neoplasm of rectosigmoid junction: Secondary | ICD-10-CM | POA: Diagnosis not present

## 2018-07-25 DIAGNOSIS — C19 Malignant neoplasm of rectosigmoid junction: Secondary | ICD-10-CM | POA: Diagnosis not present

## 2018-07-25 DIAGNOSIS — D469 Myelodysplastic syndrome, unspecified: Secondary | ICD-10-CM | POA: Diagnosis not present

## 2018-07-25 DIAGNOSIS — Z8521 Personal history of malignant neoplasm of larynx: Secondary | ICD-10-CM | POA: Diagnosis not present

## 2018-07-25 DIAGNOSIS — Z9181 History of falling: Secondary | ICD-10-CM | POA: Diagnosis not present

## 2018-07-25 DIAGNOSIS — J439 Emphysema, unspecified: Secondary | ICD-10-CM | POA: Diagnosis not present

## 2018-07-25 DIAGNOSIS — Z87891 Personal history of nicotine dependence: Secondary | ICD-10-CM | POA: Diagnosis not present

## 2018-07-25 DIAGNOSIS — H3530 Unspecified macular degeneration: Secondary | ICD-10-CM | POA: Diagnosis not present

## 2018-07-25 DIAGNOSIS — Z433 Encounter for attention to colostomy: Secondary | ICD-10-CM | POA: Diagnosis not present

## 2018-07-27 DIAGNOSIS — C19 Malignant neoplasm of rectosigmoid junction: Secondary | ICD-10-CM | POA: Diagnosis not present

## 2018-07-27 DIAGNOSIS — Z8521 Personal history of malignant neoplasm of larynx: Secondary | ICD-10-CM | POA: Diagnosis not present

## 2018-07-27 DIAGNOSIS — H3530 Unspecified macular degeneration: Secondary | ICD-10-CM | POA: Diagnosis not present

## 2018-07-27 DIAGNOSIS — Z9181 History of falling: Secondary | ICD-10-CM | POA: Diagnosis not present

## 2018-07-27 DIAGNOSIS — D469 Myelodysplastic syndrome, unspecified: Secondary | ICD-10-CM | POA: Diagnosis not present

## 2018-07-27 DIAGNOSIS — Z433 Encounter for attention to colostomy: Secondary | ICD-10-CM | POA: Diagnosis not present

## 2018-07-27 DIAGNOSIS — J439 Emphysema, unspecified: Secondary | ICD-10-CM | POA: Diagnosis not present

## 2018-07-27 DIAGNOSIS — Z87891 Personal history of nicotine dependence: Secondary | ICD-10-CM | POA: Diagnosis not present

## 2018-07-29 DIAGNOSIS — D469 Myelodysplastic syndrome, unspecified: Secondary | ICD-10-CM | POA: Diagnosis not present

## 2018-07-29 DIAGNOSIS — Z9181 History of falling: Secondary | ICD-10-CM | POA: Diagnosis not present

## 2018-07-29 DIAGNOSIS — H3530 Unspecified macular degeneration: Secondary | ICD-10-CM | POA: Diagnosis not present

## 2018-07-29 DIAGNOSIS — Z87891 Personal history of nicotine dependence: Secondary | ICD-10-CM | POA: Diagnosis not present

## 2018-07-29 DIAGNOSIS — Z8521 Personal history of malignant neoplasm of larynx: Secondary | ICD-10-CM | POA: Diagnosis not present

## 2018-07-29 DIAGNOSIS — Z433 Encounter for attention to colostomy: Secondary | ICD-10-CM | POA: Diagnosis not present

## 2018-07-29 DIAGNOSIS — J439 Emphysema, unspecified: Secondary | ICD-10-CM | POA: Diagnosis not present

## 2018-07-29 DIAGNOSIS — C19 Malignant neoplasm of rectosigmoid junction: Secondary | ICD-10-CM | POA: Diagnosis not present

## 2018-08-01 DIAGNOSIS — Z433 Encounter for attention to colostomy: Secondary | ICD-10-CM | POA: Diagnosis not present

## 2018-08-01 DIAGNOSIS — C19 Malignant neoplasm of rectosigmoid junction: Secondary | ICD-10-CM | POA: Diagnosis not present

## 2018-08-01 DIAGNOSIS — J439 Emphysema, unspecified: Secondary | ICD-10-CM | POA: Diagnosis not present

## 2018-08-01 DIAGNOSIS — Z87891 Personal history of nicotine dependence: Secondary | ICD-10-CM | POA: Diagnosis not present

## 2018-08-01 DIAGNOSIS — D469 Myelodysplastic syndrome, unspecified: Secondary | ICD-10-CM | POA: Diagnosis not present

## 2018-08-01 DIAGNOSIS — Z9181 History of falling: Secondary | ICD-10-CM | POA: Diagnosis not present

## 2018-08-01 DIAGNOSIS — Z8521 Personal history of malignant neoplasm of larynx: Secondary | ICD-10-CM | POA: Diagnosis not present

## 2018-08-01 DIAGNOSIS — H3530 Unspecified macular degeneration: Secondary | ICD-10-CM | POA: Diagnosis not present

## 2018-08-02 DIAGNOSIS — Z8521 Personal history of malignant neoplasm of larynx: Secondary | ICD-10-CM | POA: Diagnosis not present

## 2018-08-02 DIAGNOSIS — C20 Malignant neoplasm of rectum: Secondary | ICD-10-CM | POA: Diagnosis not present

## 2018-08-02 DIAGNOSIS — C946 Myelodysplastic disease, not classified: Secondary | ICD-10-CM | POA: Diagnosis not present

## 2018-08-03 DIAGNOSIS — Z8521 Personal history of malignant neoplasm of larynx: Secondary | ICD-10-CM | POA: Diagnosis not present

## 2018-08-03 DIAGNOSIS — D469 Myelodysplastic syndrome, unspecified: Secondary | ICD-10-CM | POA: Diagnosis not present

## 2018-08-03 DIAGNOSIS — C19 Malignant neoplasm of rectosigmoid junction: Secondary | ICD-10-CM | POA: Diagnosis not present

## 2018-08-03 DIAGNOSIS — Z87891 Personal history of nicotine dependence: Secondary | ICD-10-CM | POA: Diagnosis not present

## 2018-08-03 DIAGNOSIS — J439 Emphysema, unspecified: Secondary | ICD-10-CM | POA: Diagnosis not present

## 2018-08-03 DIAGNOSIS — H3530 Unspecified macular degeneration: Secondary | ICD-10-CM | POA: Diagnosis not present

## 2018-08-03 DIAGNOSIS — Z433 Encounter for attention to colostomy: Secondary | ICD-10-CM | POA: Diagnosis not present

## 2018-08-03 DIAGNOSIS — Z9181 History of falling: Secondary | ICD-10-CM | POA: Diagnosis not present

## 2018-08-05 DIAGNOSIS — Z433 Encounter for attention to colostomy: Secondary | ICD-10-CM | POA: Diagnosis not present

## 2018-08-05 DIAGNOSIS — H3530 Unspecified macular degeneration: Secondary | ICD-10-CM | POA: Diagnosis not present

## 2018-08-05 DIAGNOSIS — Z9181 History of falling: Secondary | ICD-10-CM | POA: Diagnosis not present

## 2018-08-05 DIAGNOSIS — D469 Myelodysplastic syndrome, unspecified: Secondary | ICD-10-CM | POA: Diagnosis not present

## 2018-08-05 DIAGNOSIS — Z8521 Personal history of malignant neoplasm of larynx: Secondary | ICD-10-CM | POA: Diagnosis not present

## 2018-08-05 DIAGNOSIS — C19 Malignant neoplasm of rectosigmoid junction: Secondary | ICD-10-CM | POA: Diagnosis not present

## 2018-08-05 DIAGNOSIS — Z87891 Personal history of nicotine dependence: Secondary | ICD-10-CM | POA: Diagnosis not present

## 2018-08-05 DIAGNOSIS — J439 Emphysema, unspecified: Secondary | ICD-10-CM | POA: Diagnosis not present

## 2018-08-07 DIAGNOSIS — Z9181 History of falling: Secondary | ICD-10-CM | POA: Diagnosis not present

## 2018-08-07 DIAGNOSIS — Z433 Encounter for attention to colostomy: Secondary | ICD-10-CM | POA: Diagnosis not present

## 2018-08-07 DIAGNOSIS — C19 Malignant neoplasm of rectosigmoid junction: Secondary | ICD-10-CM | POA: Diagnosis not present

## 2018-08-07 DIAGNOSIS — Z87891 Personal history of nicotine dependence: Secondary | ICD-10-CM | POA: Diagnosis not present

## 2018-08-07 DIAGNOSIS — D469 Myelodysplastic syndrome, unspecified: Secondary | ICD-10-CM | POA: Diagnosis not present

## 2018-08-07 DIAGNOSIS — J439 Emphysema, unspecified: Secondary | ICD-10-CM | POA: Diagnosis not present

## 2018-08-07 DIAGNOSIS — H3530 Unspecified macular degeneration: Secondary | ICD-10-CM | POA: Diagnosis not present

## 2018-08-07 DIAGNOSIS — Z8521 Personal history of malignant neoplasm of larynx: Secondary | ICD-10-CM | POA: Diagnosis not present

## 2018-08-09 DIAGNOSIS — Z8521 Personal history of malignant neoplasm of larynx: Secondary | ICD-10-CM | POA: Diagnosis not present

## 2018-08-09 DIAGNOSIS — H3530 Unspecified macular degeneration: Secondary | ICD-10-CM | POA: Diagnosis not present

## 2018-08-09 DIAGNOSIS — J439 Emphysema, unspecified: Secondary | ICD-10-CM | POA: Diagnosis not present

## 2018-08-09 DIAGNOSIS — Z87891 Personal history of nicotine dependence: Secondary | ICD-10-CM | POA: Diagnosis not present

## 2018-08-09 DIAGNOSIS — C19 Malignant neoplasm of rectosigmoid junction: Secondary | ICD-10-CM | POA: Diagnosis not present

## 2018-08-09 DIAGNOSIS — D469 Myelodysplastic syndrome, unspecified: Secondary | ICD-10-CM | POA: Diagnosis not present

## 2018-08-09 DIAGNOSIS — Z433 Encounter for attention to colostomy: Secondary | ICD-10-CM | POA: Diagnosis not present

## 2018-08-09 DIAGNOSIS — Z9181 History of falling: Secondary | ICD-10-CM | POA: Diagnosis not present

## 2018-08-11 DIAGNOSIS — Z87891 Personal history of nicotine dependence: Secondary | ICD-10-CM | POA: Diagnosis not present

## 2018-08-11 DIAGNOSIS — D469 Myelodysplastic syndrome, unspecified: Secondary | ICD-10-CM | POA: Diagnosis not present

## 2018-08-11 DIAGNOSIS — Z9181 History of falling: Secondary | ICD-10-CM | POA: Diagnosis not present

## 2018-08-11 DIAGNOSIS — Z433 Encounter for attention to colostomy: Secondary | ICD-10-CM | POA: Diagnosis not present

## 2018-08-11 DIAGNOSIS — J439 Emphysema, unspecified: Secondary | ICD-10-CM | POA: Diagnosis not present

## 2018-08-11 DIAGNOSIS — H3530 Unspecified macular degeneration: Secondary | ICD-10-CM | POA: Diagnosis not present

## 2018-08-11 DIAGNOSIS — Z8521 Personal history of malignant neoplasm of larynx: Secondary | ICD-10-CM | POA: Diagnosis not present

## 2018-08-11 DIAGNOSIS — C19 Malignant neoplasm of rectosigmoid junction: Secondary | ICD-10-CM | POA: Diagnosis not present

## 2018-08-13 DIAGNOSIS — Z87891 Personal history of nicotine dependence: Secondary | ICD-10-CM | POA: Diagnosis not present

## 2018-08-13 DIAGNOSIS — D469 Myelodysplastic syndrome, unspecified: Secondary | ICD-10-CM | POA: Diagnosis not present

## 2018-08-13 DIAGNOSIS — Z433 Encounter for attention to colostomy: Secondary | ICD-10-CM | POA: Diagnosis not present

## 2018-08-13 DIAGNOSIS — Z9181 History of falling: Secondary | ICD-10-CM | POA: Diagnosis not present

## 2018-08-13 DIAGNOSIS — J439 Emphysema, unspecified: Secondary | ICD-10-CM | POA: Diagnosis not present

## 2018-08-13 DIAGNOSIS — H3530 Unspecified macular degeneration: Secondary | ICD-10-CM | POA: Diagnosis not present

## 2018-08-13 DIAGNOSIS — C19 Malignant neoplasm of rectosigmoid junction: Secondary | ICD-10-CM | POA: Diagnosis not present

## 2018-08-13 DIAGNOSIS — Z8521 Personal history of malignant neoplasm of larynx: Secondary | ICD-10-CM | POA: Diagnosis not present

## 2018-08-15 DIAGNOSIS — H3530 Unspecified macular degeneration: Secondary | ICD-10-CM | POA: Diagnosis not present

## 2018-08-15 DIAGNOSIS — D469 Myelodysplastic syndrome, unspecified: Secondary | ICD-10-CM | POA: Diagnosis not present

## 2018-08-15 DIAGNOSIS — J439 Emphysema, unspecified: Secondary | ICD-10-CM | POA: Diagnosis not present

## 2018-08-15 DIAGNOSIS — Z433 Encounter for attention to colostomy: Secondary | ICD-10-CM | POA: Diagnosis not present

## 2018-08-15 DIAGNOSIS — Z8521 Personal history of malignant neoplasm of larynx: Secondary | ICD-10-CM | POA: Diagnosis not present

## 2018-08-15 DIAGNOSIS — C19 Malignant neoplasm of rectosigmoid junction: Secondary | ICD-10-CM | POA: Diagnosis not present

## 2018-08-15 DIAGNOSIS — Z87891 Personal history of nicotine dependence: Secondary | ICD-10-CM | POA: Diagnosis not present

## 2018-08-15 DIAGNOSIS — Z9181 History of falling: Secondary | ICD-10-CM | POA: Diagnosis not present

## 2018-08-18 DIAGNOSIS — C19 Malignant neoplasm of rectosigmoid junction: Secondary | ICD-10-CM | POA: Diagnosis not present

## 2018-08-18 DIAGNOSIS — Z9181 History of falling: Secondary | ICD-10-CM | POA: Diagnosis not present

## 2018-08-18 DIAGNOSIS — H3530 Unspecified macular degeneration: Secondary | ICD-10-CM | POA: Diagnosis not present

## 2018-08-18 DIAGNOSIS — Z433 Encounter for attention to colostomy: Secondary | ICD-10-CM | POA: Diagnosis not present

## 2018-08-18 DIAGNOSIS — D469 Myelodysplastic syndrome, unspecified: Secondary | ICD-10-CM | POA: Diagnosis not present

## 2018-08-18 DIAGNOSIS — J439 Emphysema, unspecified: Secondary | ICD-10-CM | POA: Diagnosis not present

## 2018-08-18 DIAGNOSIS — Z8521 Personal history of malignant neoplasm of larynx: Secondary | ICD-10-CM | POA: Diagnosis not present

## 2018-08-18 DIAGNOSIS — Z87891 Personal history of nicotine dependence: Secondary | ICD-10-CM | POA: Diagnosis not present

## 2018-08-21 DIAGNOSIS — J439 Emphysema, unspecified: Secondary | ICD-10-CM | POA: Diagnosis not present

## 2018-08-21 DIAGNOSIS — D469 Myelodysplastic syndrome, unspecified: Secondary | ICD-10-CM | POA: Diagnosis not present

## 2018-08-21 DIAGNOSIS — Z433 Encounter for attention to colostomy: Secondary | ICD-10-CM | POA: Diagnosis not present

## 2018-08-21 DIAGNOSIS — H3530 Unspecified macular degeneration: Secondary | ICD-10-CM | POA: Diagnosis not present

## 2018-08-21 DIAGNOSIS — C19 Malignant neoplasm of rectosigmoid junction: Secondary | ICD-10-CM | POA: Diagnosis not present

## 2018-08-21 DIAGNOSIS — Z87891 Personal history of nicotine dependence: Secondary | ICD-10-CM | POA: Diagnosis not present

## 2018-08-21 DIAGNOSIS — Z9181 History of falling: Secondary | ICD-10-CM | POA: Diagnosis not present

## 2018-08-21 DIAGNOSIS — Z8521 Personal history of malignant neoplasm of larynx: Secondary | ICD-10-CM | POA: Diagnosis not present

## 2018-08-24 DIAGNOSIS — Z9181 History of falling: Secondary | ICD-10-CM | POA: Diagnosis not present

## 2018-08-24 DIAGNOSIS — C19 Malignant neoplasm of rectosigmoid junction: Secondary | ICD-10-CM | POA: Diagnosis not present

## 2018-08-24 DIAGNOSIS — J439 Emphysema, unspecified: Secondary | ICD-10-CM | POA: Diagnosis not present

## 2018-08-24 DIAGNOSIS — Z8521 Personal history of malignant neoplasm of larynx: Secondary | ICD-10-CM | POA: Diagnosis not present

## 2018-08-24 DIAGNOSIS — Z433 Encounter for attention to colostomy: Secondary | ICD-10-CM | POA: Diagnosis not present

## 2018-08-24 DIAGNOSIS — H3530 Unspecified macular degeneration: Secondary | ICD-10-CM | POA: Diagnosis not present

## 2018-08-24 DIAGNOSIS — D469 Myelodysplastic syndrome, unspecified: Secondary | ICD-10-CM | POA: Diagnosis not present

## 2018-08-24 DIAGNOSIS — Z87891 Personal history of nicotine dependence: Secondary | ICD-10-CM | POA: Diagnosis not present

## 2018-08-27 DIAGNOSIS — Z9181 History of falling: Secondary | ICD-10-CM | POA: Diagnosis not present

## 2018-08-27 DIAGNOSIS — J439 Emphysema, unspecified: Secondary | ICD-10-CM | POA: Diagnosis not present

## 2018-08-27 DIAGNOSIS — H3530 Unspecified macular degeneration: Secondary | ICD-10-CM | POA: Diagnosis not present

## 2018-08-27 DIAGNOSIS — Z8521 Personal history of malignant neoplasm of larynx: Secondary | ICD-10-CM | POA: Diagnosis not present

## 2018-08-27 DIAGNOSIS — Z87891 Personal history of nicotine dependence: Secondary | ICD-10-CM | POA: Diagnosis not present

## 2018-08-27 DIAGNOSIS — Z433 Encounter for attention to colostomy: Secondary | ICD-10-CM | POA: Diagnosis not present

## 2018-08-27 DIAGNOSIS — C19 Malignant neoplasm of rectosigmoid junction: Secondary | ICD-10-CM | POA: Diagnosis not present

## 2018-08-27 DIAGNOSIS — D539 Nutritional anemia, unspecified: Secondary | ICD-10-CM | POA: Diagnosis not present

## 2018-08-27 DIAGNOSIS — Z6824 Body mass index (BMI) 24.0-24.9, adult: Secondary | ICD-10-CM | POA: Diagnosis not present

## 2018-08-27 DIAGNOSIS — D469 Myelodysplastic syndrome, unspecified: Secondary | ICD-10-CM | POA: Diagnosis not present

## 2018-09-02 DIAGNOSIS — D539 Nutritional anemia, unspecified: Secondary | ICD-10-CM | POA: Diagnosis not present

## 2018-09-02 DIAGNOSIS — H3530 Unspecified macular degeneration: Secondary | ICD-10-CM | POA: Diagnosis not present

## 2018-09-02 DIAGNOSIS — D469 Myelodysplastic syndrome, unspecified: Secondary | ICD-10-CM | POA: Diagnosis not present

## 2018-09-02 DIAGNOSIS — C19 Malignant neoplasm of rectosigmoid junction: Secondary | ICD-10-CM | POA: Diagnosis not present

## 2018-09-02 DIAGNOSIS — Z8521 Personal history of malignant neoplasm of larynx: Secondary | ICD-10-CM | POA: Diagnosis not present

## 2018-09-02 DIAGNOSIS — Z6824 Body mass index (BMI) 24.0-24.9, adult: Secondary | ICD-10-CM | POA: Diagnosis not present

## 2018-09-02 DIAGNOSIS — J439 Emphysema, unspecified: Secondary | ICD-10-CM | POA: Diagnosis not present

## 2018-09-02 DIAGNOSIS — Z433 Encounter for attention to colostomy: Secondary | ICD-10-CM | POA: Diagnosis not present

## 2018-09-02 DIAGNOSIS — Z87891 Personal history of nicotine dependence: Secondary | ICD-10-CM | POA: Diagnosis not present

## 2018-09-02 DIAGNOSIS — Z9181 History of falling: Secondary | ICD-10-CM | POA: Diagnosis not present

## 2018-09-04 DIAGNOSIS — Z433 Encounter for attention to colostomy: Secondary | ICD-10-CM | POA: Diagnosis not present

## 2018-09-04 DIAGNOSIS — Z6824 Body mass index (BMI) 24.0-24.9, adult: Secondary | ICD-10-CM | POA: Diagnosis not present

## 2018-09-04 DIAGNOSIS — D469 Myelodysplastic syndrome, unspecified: Secondary | ICD-10-CM | POA: Diagnosis not present

## 2018-09-04 DIAGNOSIS — Z9181 History of falling: Secondary | ICD-10-CM | POA: Diagnosis not present

## 2018-09-04 DIAGNOSIS — Z87891 Personal history of nicotine dependence: Secondary | ICD-10-CM | POA: Diagnosis not present

## 2018-09-04 DIAGNOSIS — J439 Emphysema, unspecified: Secondary | ICD-10-CM | POA: Diagnosis not present

## 2018-09-04 DIAGNOSIS — C19 Malignant neoplasm of rectosigmoid junction: Secondary | ICD-10-CM | POA: Diagnosis not present

## 2018-09-04 DIAGNOSIS — D539 Nutritional anemia, unspecified: Secondary | ICD-10-CM | POA: Diagnosis not present

## 2018-09-04 DIAGNOSIS — H3530 Unspecified macular degeneration: Secondary | ICD-10-CM | POA: Diagnosis not present

## 2018-09-04 DIAGNOSIS — Z8521 Personal history of malignant neoplasm of larynx: Secondary | ICD-10-CM | POA: Diagnosis not present

## 2018-09-06 DIAGNOSIS — Z433 Encounter for attention to colostomy: Secondary | ICD-10-CM | POA: Diagnosis not present

## 2018-09-06 DIAGNOSIS — D469 Myelodysplastic syndrome, unspecified: Secondary | ICD-10-CM | POA: Diagnosis not present

## 2018-09-06 DIAGNOSIS — Z9181 History of falling: Secondary | ICD-10-CM | POA: Diagnosis not present

## 2018-09-06 DIAGNOSIS — Z8521 Personal history of malignant neoplasm of larynx: Secondary | ICD-10-CM | POA: Diagnosis not present

## 2018-09-06 DIAGNOSIS — J439 Emphysema, unspecified: Secondary | ICD-10-CM | POA: Diagnosis not present

## 2018-09-06 DIAGNOSIS — C19 Malignant neoplasm of rectosigmoid junction: Secondary | ICD-10-CM | POA: Diagnosis not present

## 2018-09-06 DIAGNOSIS — D539 Nutritional anemia, unspecified: Secondary | ICD-10-CM | POA: Diagnosis not present

## 2018-09-06 DIAGNOSIS — H3530 Unspecified macular degeneration: Secondary | ICD-10-CM | POA: Diagnosis not present

## 2018-09-06 DIAGNOSIS — Z6824 Body mass index (BMI) 24.0-24.9, adult: Secondary | ICD-10-CM | POA: Diagnosis not present

## 2018-09-06 DIAGNOSIS — Z87891 Personal history of nicotine dependence: Secondary | ICD-10-CM | POA: Diagnosis not present

## 2018-09-08 DIAGNOSIS — Z87891 Personal history of nicotine dependence: Secondary | ICD-10-CM | POA: Diagnosis not present

## 2018-09-08 DIAGNOSIS — D539 Nutritional anemia, unspecified: Secondary | ICD-10-CM | POA: Diagnosis not present

## 2018-09-08 DIAGNOSIS — Z8521 Personal history of malignant neoplasm of larynx: Secondary | ICD-10-CM | POA: Diagnosis not present

## 2018-09-08 DIAGNOSIS — C19 Malignant neoplasm of rectosigmoid junction: Secondary | ICD-10-CM | POA: Diagnosis not present

## 2018-09-08 DIAGNOSIS — H3530 Unspecified macular degeneration: Secondary | ICD-10-CM | POA: Diagnosis not present

## 2018-09-08 DIAGNOSIS — Z9181 History of falling: Secondary | ICD-10-CM | POA: Diagnosis not present

## 2018-09-08 DIAGNOSIS — Z433 Encounter for attention to colostomy: Secondary | ICD-10-CM | POA: Diagnosis not present

## 2018-09-08 DIAGNOSIS — J439 Emphysema, unspecified: Secondary | ICD-10-CM | POA: Diagnosis not present

## 2018-09-08 DIAGNOSIS — D469 Myelodysplastic syndrome, unspecified: Secondary | ICD-10-CM | POA: Diagnosis not present

## 2018-09-08 DIAGNOSIS — Z6824 Body mass index (BMI) 24.0-24.9, adult: Secondary | ICD-10-CM | POA: Diagnosis not present

## 2018-09-10 DIAGNOSIS — Z8521 Personal history of malignant neoplasm of larynx: Secondary | ICD-10-CM | POA: Diagnosis not present

## 2018-09-10 DIAGNOSIS — J439 Emphysema, unspecified: Secondary | ICD-10-CM | POA: Diagnosis not present

## 2018-09-10 DIAGNOSIS — Z433 Encounter for attention to colostomy: Secondary | ICD-10-CM | POA: Diagnosis not present

## 2018-09-10 DIAGNOSIS — Z9181 History of falling: Secondary | ICD-10-CM | POA: Diagnosis not present

## 2018-09-10 DIAGNOSIS — D469 Myelodysplastic syndrome, unspecified: Secondary | ICD-10-CM | POA: Diagnosis not present

## 2018-09-10 DIAGNOSIS — D539 Nutritional anemia, unspecified: Secondary | ICD-10-CM | POA: Diagnosis not present

## 2018-09-10 DIAGNOSIS — H3530 Unspecified macular degeneration: Secondary | ICD-10-CM | POA: Diagnosis not present

## 2018-09-10 DIAGNOSIS — Z87891 Personal history of nicotine dependence: Secondary | ICD-10-CM | POA: Diagnosis not present

## 2018-09-10 DIAGNOSIS — Z6824 Body mass index (BMI) 24.0-24.9, adult: Secondary | ICD-10-CM | POA: Diagnosis not present

## 2018-09-10 DIAGNOSIS — C19 Malignant neoplasm of rectosigmoid junction: Secondary | ICD-10-CM | POA: Diagnosis not present

## 2018-09-12 DIAGNOSIS — D539 Nutritional anemia, unspecified: Secondary | ICD-10-CM | POA: Diagnosis not present

## 2018-09-12 DIAGNOSIS — H3530 Unspecified macular degeneration: Secondary | ICD-10-CM | POA: Diagnosis not present

## 2018-09-12 DIAGNOSIS — D469 Myelodysplastic syndrome, unspecified: Secondary | ICD-10-CM | POA: Diagnosis not present

## 2018-09-12 DIAGNOSIS — Z9181 History of falling: Secondary | ICD-10-CM | POA: Diagnosis not present

## 2018-09-12 DIAGNOSIS — Z87891 Personal history of nicotine dependence: Secondary | ICD-10-CM | POA: Diagnosis not present

## 2018-09-12 DIAGNOSIS — Z8521 Personal history of malignant neoplasm of larynx: Secondary | ICD-10-CM | POA: Diagnosis not present

## 2018-09-12 DIAGNOSIS — Z6824 Body mass index (BMI) 24.0-24.9, adult: Secondary | ICD-10-CM | POA: Diagnosis not present

## 2018-09-12 DIAGNOSIS — C19 Malignant neoplasm of rectosigmoid junction: Secondary | ICD-10-CM | POA: Diagnosis not present

## 2018-09-12 DIAGNOSIS — J439 Emphysema, unspecified: Secondary | ICD-10-CM | POA: Diagnosis not present

## 2018-09-12 DIAGNOSIS — Z433 Encounter for attention to colostomy: Secondary | ICD-10-CM | POA: Diagnosis not present

## 2018-09-13 DIAGNOSIS — C946 Myelodysplastic disease, not classified: Secondary | ICD-10-CM | POA: Diagnosis not present

## 2018-09-14 DIAGNOSIS — J439 Emphysema, unspecified: Secondary | ICD-10-CM | POA: Diagnosis not present

## 2018-09-14 DIAGNOSIS — Z9181 History of falling: Secondary | ICD-10-CM | POA: Diagnosis not present

## 2018-09-14 DIAGNOSIS — D469 Myelodysplastic syndrome, unspecified: Secondary | ICD-10-CM | POA: Diagnosis not present

## 2018-09-14 DIAGNOSIS — Z87891 Personal history of nicotine dependence: Secondary | ICD-10-CM | POA: Diagnosis not present

## 2018-09-14 DIAGNOSIS — C19 Malignant neoplasm of rectosigmoid junction: Secondary | ICD-10-CM | POA: Diagnosis not present

## 2018-09-14 DIAGNOSIS — H3530 Unspecified macular degeneration: Secondary | ICD-10-CM | POA: Diagnosis not present

## 2018-09-14 DIAGNOSIS — Z8521 Personal history of malignant neoplasm of larynx: Secondary | ICD-10-CM | POA: Diagnosis not present

## 2018-09-14 DIAGNOSIS — D539 Nutritional anemia, unspecified: Secondary | ICD-10-CM | POA: Diagnosis not present

## 2018-09-14 DIAGNOSIS — Z433 Encounter for attention to colostomy: Secondary | ICD-10-CM | POA: Diagnosis not present

## 2018-09-14 DIAGNOSIS — Z6824 Body mass index (BMI) 24.0-24.9, adult: Secondary | ICD-10-CM | POA: Diagnosis not present

## 2018-09-16 DIAGNOSIS — Z87891 Personal history of nicotine dependence: Secondary | ICD-10-CM | POA: Diagnosis not present

## 2018-09-16 DIAGNOSIS — Z6824 Body mass index (BMI) 24.0-24.9, adult: Secondary | ICD-10-CM | POA: Diagnosis not present

## 2018-09-16 DIAGNOSIS — D469 Myelodysplastic syndrome, unspecified: Secondary | ICD-10-CM | POA: Diagnosis not present

## 2018-09-16 DIAGNOSIS — C19 Malignant neoplasm of rectosigmoid junction: Secondary | ICD-10-CM | POA: Diagnosis not present

## 2018-09-16 DIAGNOSIS — Z8521 Personal history of malignant neoplasm of larynx: Secondary | ICD-10-CM | POA: Diagnosis not present

## 2018-09-16 DIAGNOSIS — Z9181 History of falling: Secondary | ICD-10-CM | POA: Diagnosis not present

## 2018-09-16 DIAGNOSIS — D539 Nutritional anemia, unspecified: Secondary | ICD-10-CM | POA: Diagnosis not present

## 2018-09-16 DIAGNOSIS — Z433 Encounter for attention to colostomy: Secondary | ICD-10-CM | POA: Diagnosis not present

## 2018-09-16 DIAGNOSIS — J439 Emphysema, unspecified: Secondary | ICD-10-CM | POA: Diagnosis not present

## 2018-09-16 DIAGNOSIS — H3530 Unspecified macular degeneration: Secondary | ICD-10-CM | POA: Diagnosis not present

## 2018-10-04 DEATH — deceased

## 2019-03-29 IMAGING — US US ABDOMEN LIMITED
1 series · 14 of 25 positions shown · non-contrast
Comparison: None.

CLINICAL DATA: Hyperbilirubinemia.

EXAM:
ULTRASOUND ABDOMEN LIMITED RIGHT UPPER QUADRANT

[Series 1: us abdomen limited · 0.18mm/px · 14 of 104 slices shown]
[im 1/104]
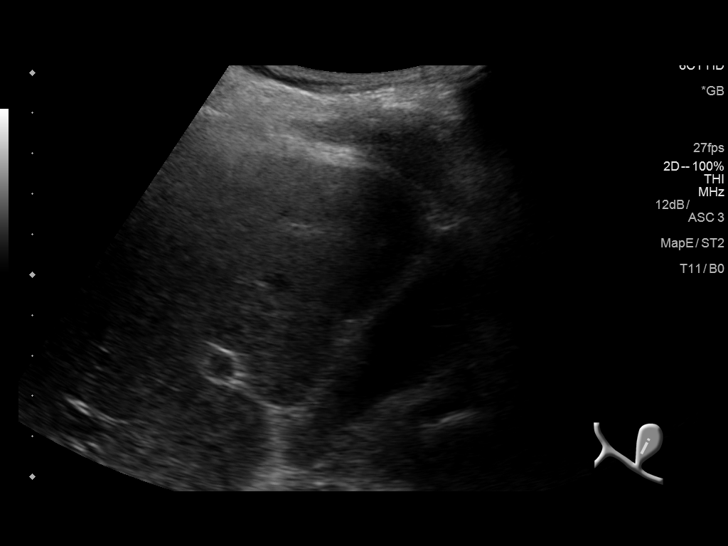
[im 9/104]
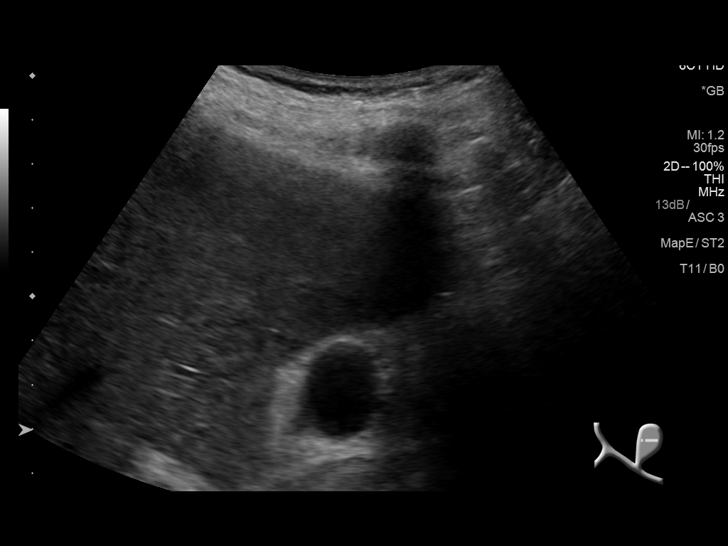
[im 18/104]
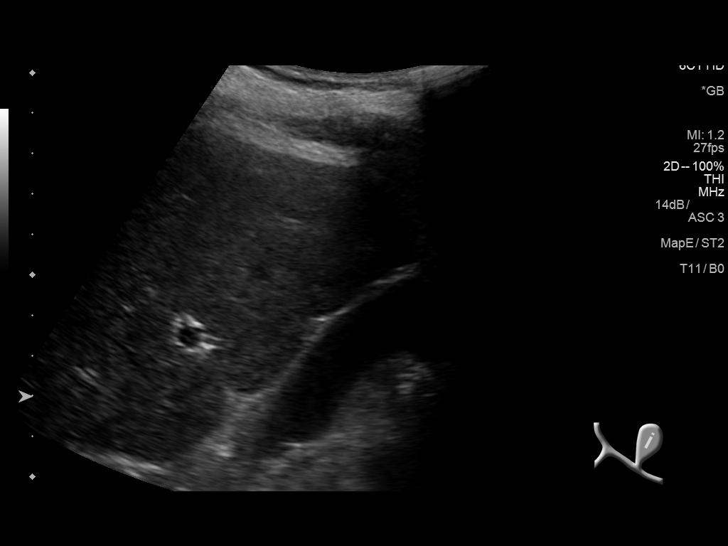
[im 26/104]
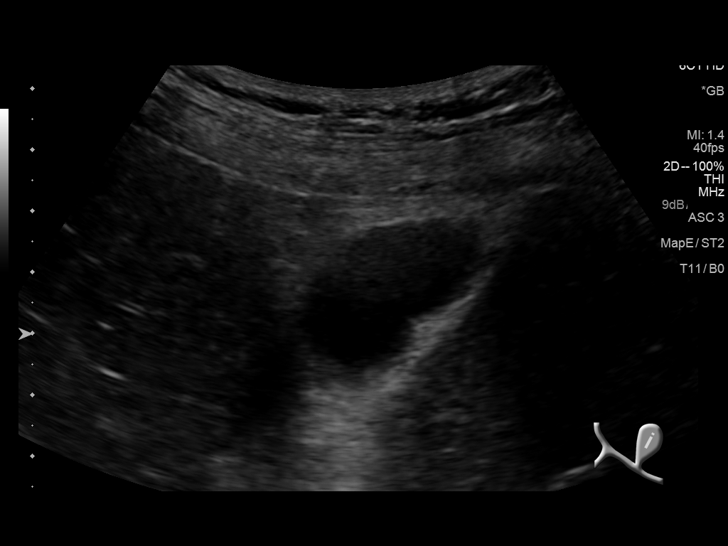
[im 35/104]
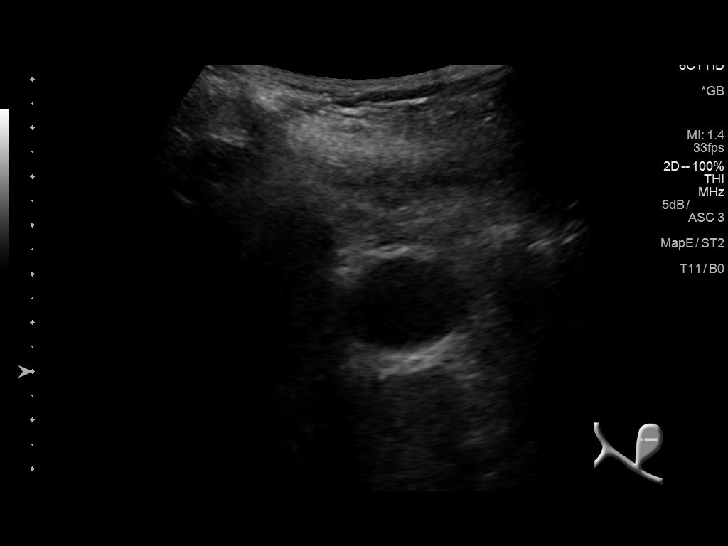
[im 39/104]
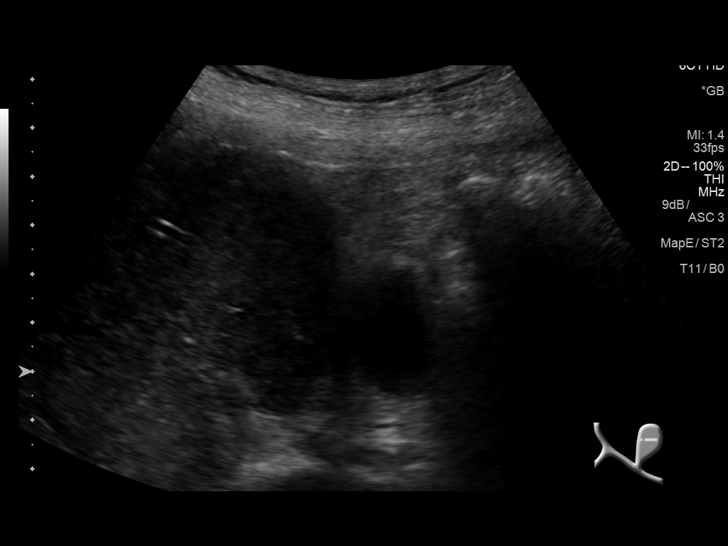
[im 48/104]
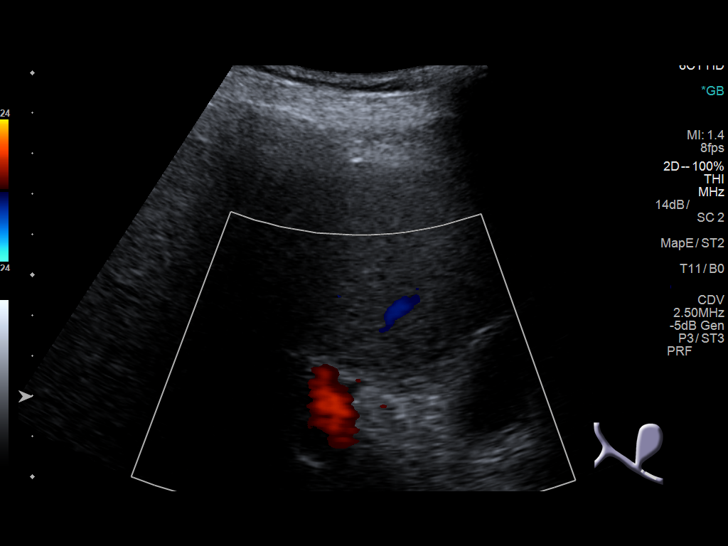
[im 56/104]
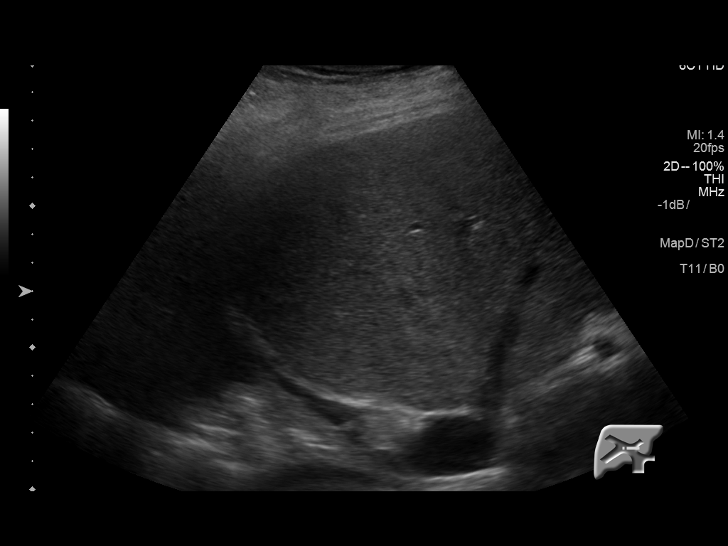
[im 65/104]
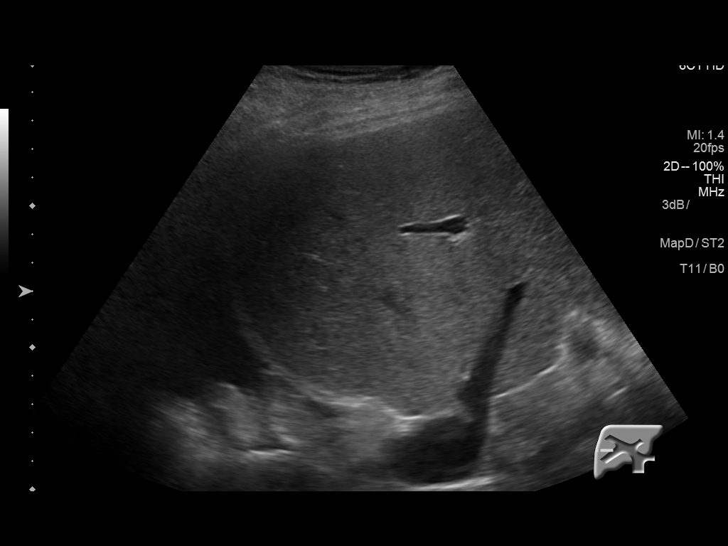
[im 69/104]
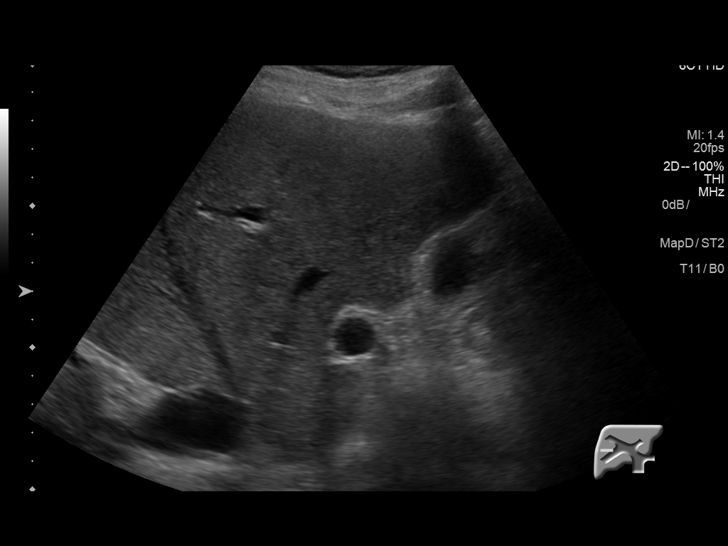
[im 78/104]
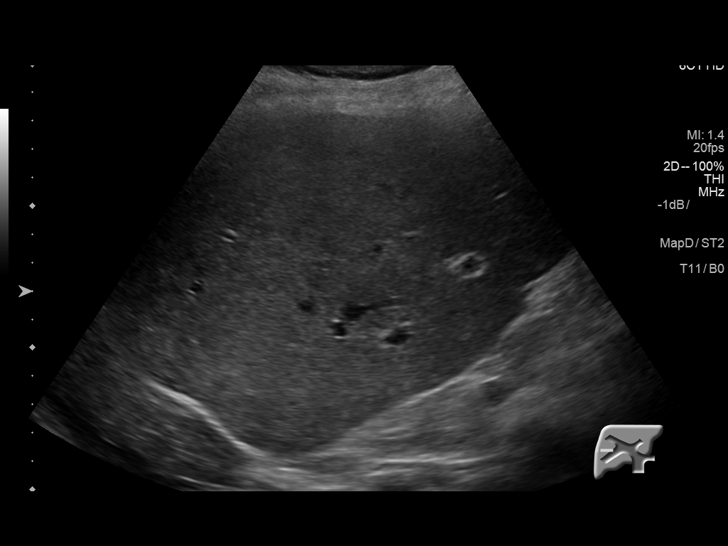
[im 86/104]
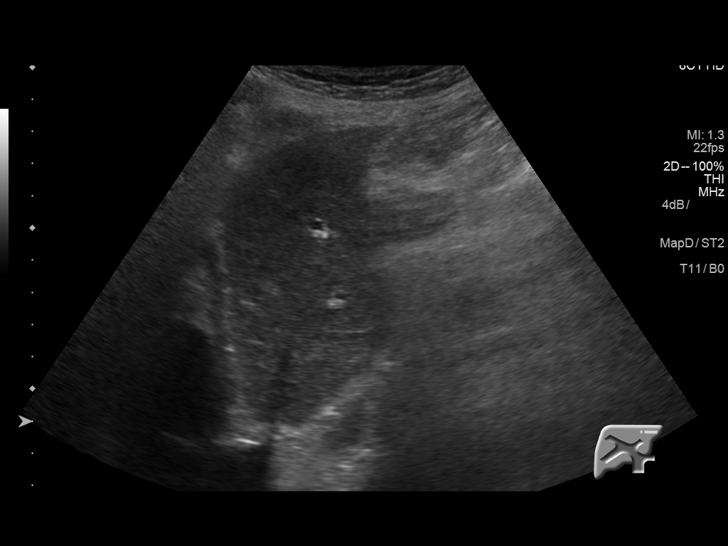
[im 95/104]
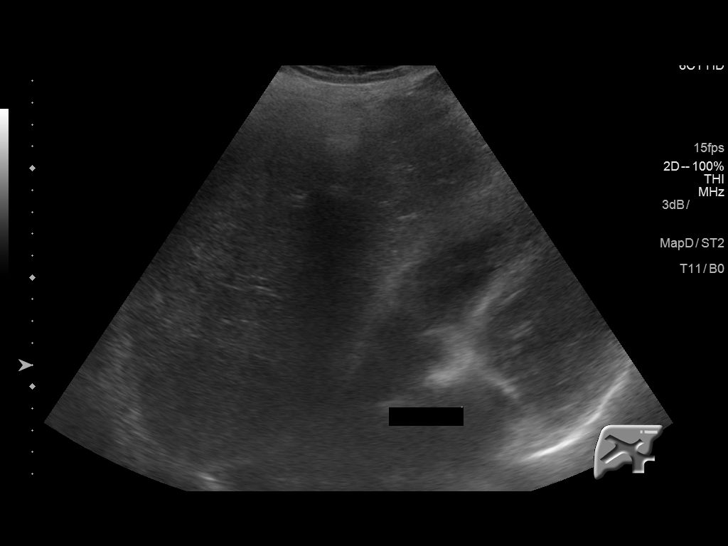
[im 104/104]
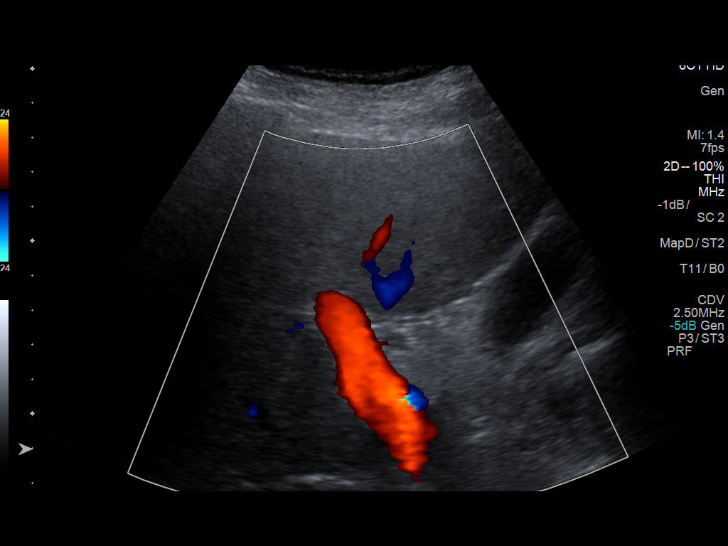

[14 of 25 positions shown; findings below may reference images not displayed]

FINDINGS: Gallbladder:

No gallstones or wall thickening visualized. No sonographic Murphy
sign noted by sonographer.

Common bile duct:

Diameter: 3.9 mm which is within normal limits.

Liver:

No focal lesion identified. Within normal limits in parenchymal
echogenicity. Portal vein is patent on color Doppler imaging with
normal direction of blood flow towards the liver.
IMPRESSION: No abnormality seen in the right upper quadrant of the abdomen.

## 2020-06-17 IMAGING — CR DG CHEST 1V PORT
1 series · 1 of 1 positions shown · non-contrast
Comparison: 06/28/2018 and prior chest radiographs

CLINICAL DATA: Central line placement.

EXAM:
PORTABLE CHEST 1 VIEW

[portable]
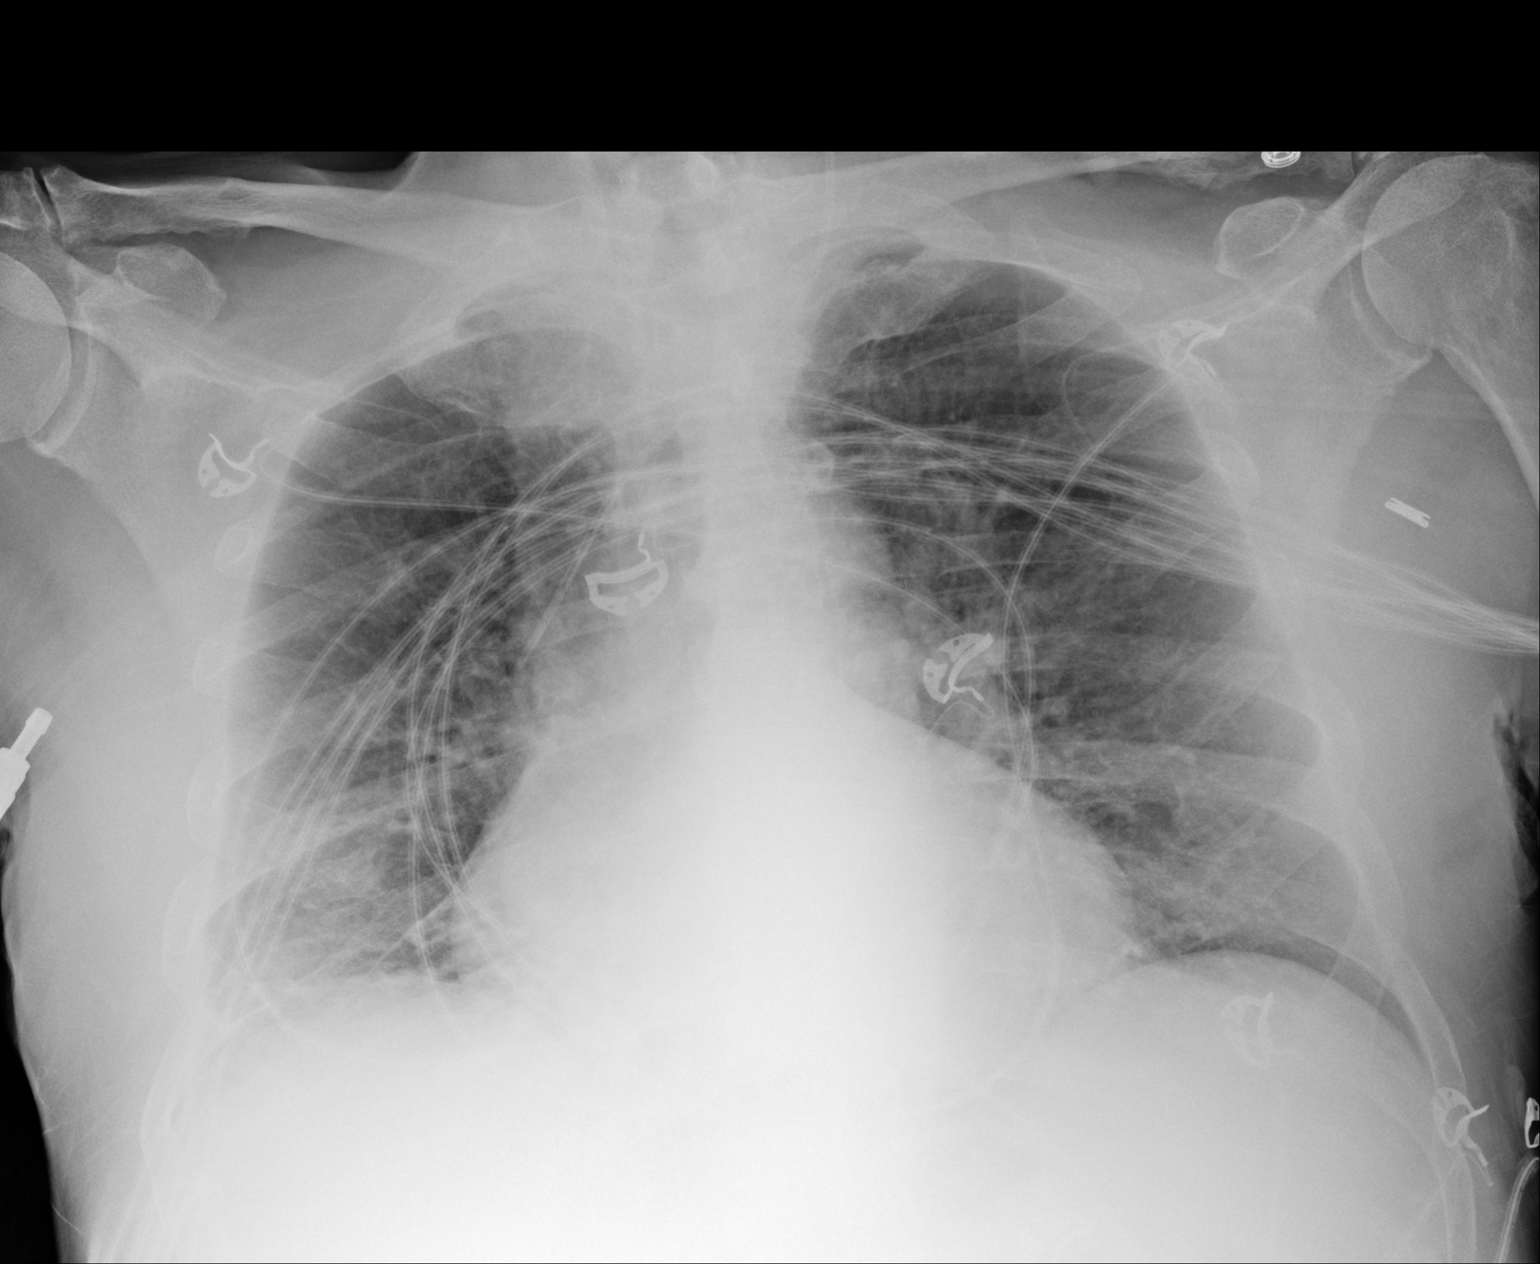

[1 of 1 positions shown; findings below may reference images not displayed]

FINDINGS: A LEFT IJ central venous catheter is noted with tip overlying the
mid SVC.

Cardiomegaly and mild pulmonary vascular congestion noted.

Mild RIGHT basilar atelectasis noted.

No pleural effusion or pneumothorax.

No significant change otherwise.
IMPRESSION: LEFT IJ central venous catheter with tip overlying the mid SVC. No
pneumothorax.

Cardiomegaly, mild pulmonary vascular congestion and RIGHT basilar
atelectasis.

## 2020-06-18 IMAGING — CR DG CHEST 1V PORT
1 series · 1 of 1 positions shown · non-contrast
Comparison: 06/28/2017

CLINICAL DATA: History of sepsis

EXAM:
PORTABLE CHEST 1 VIEW

[portable]
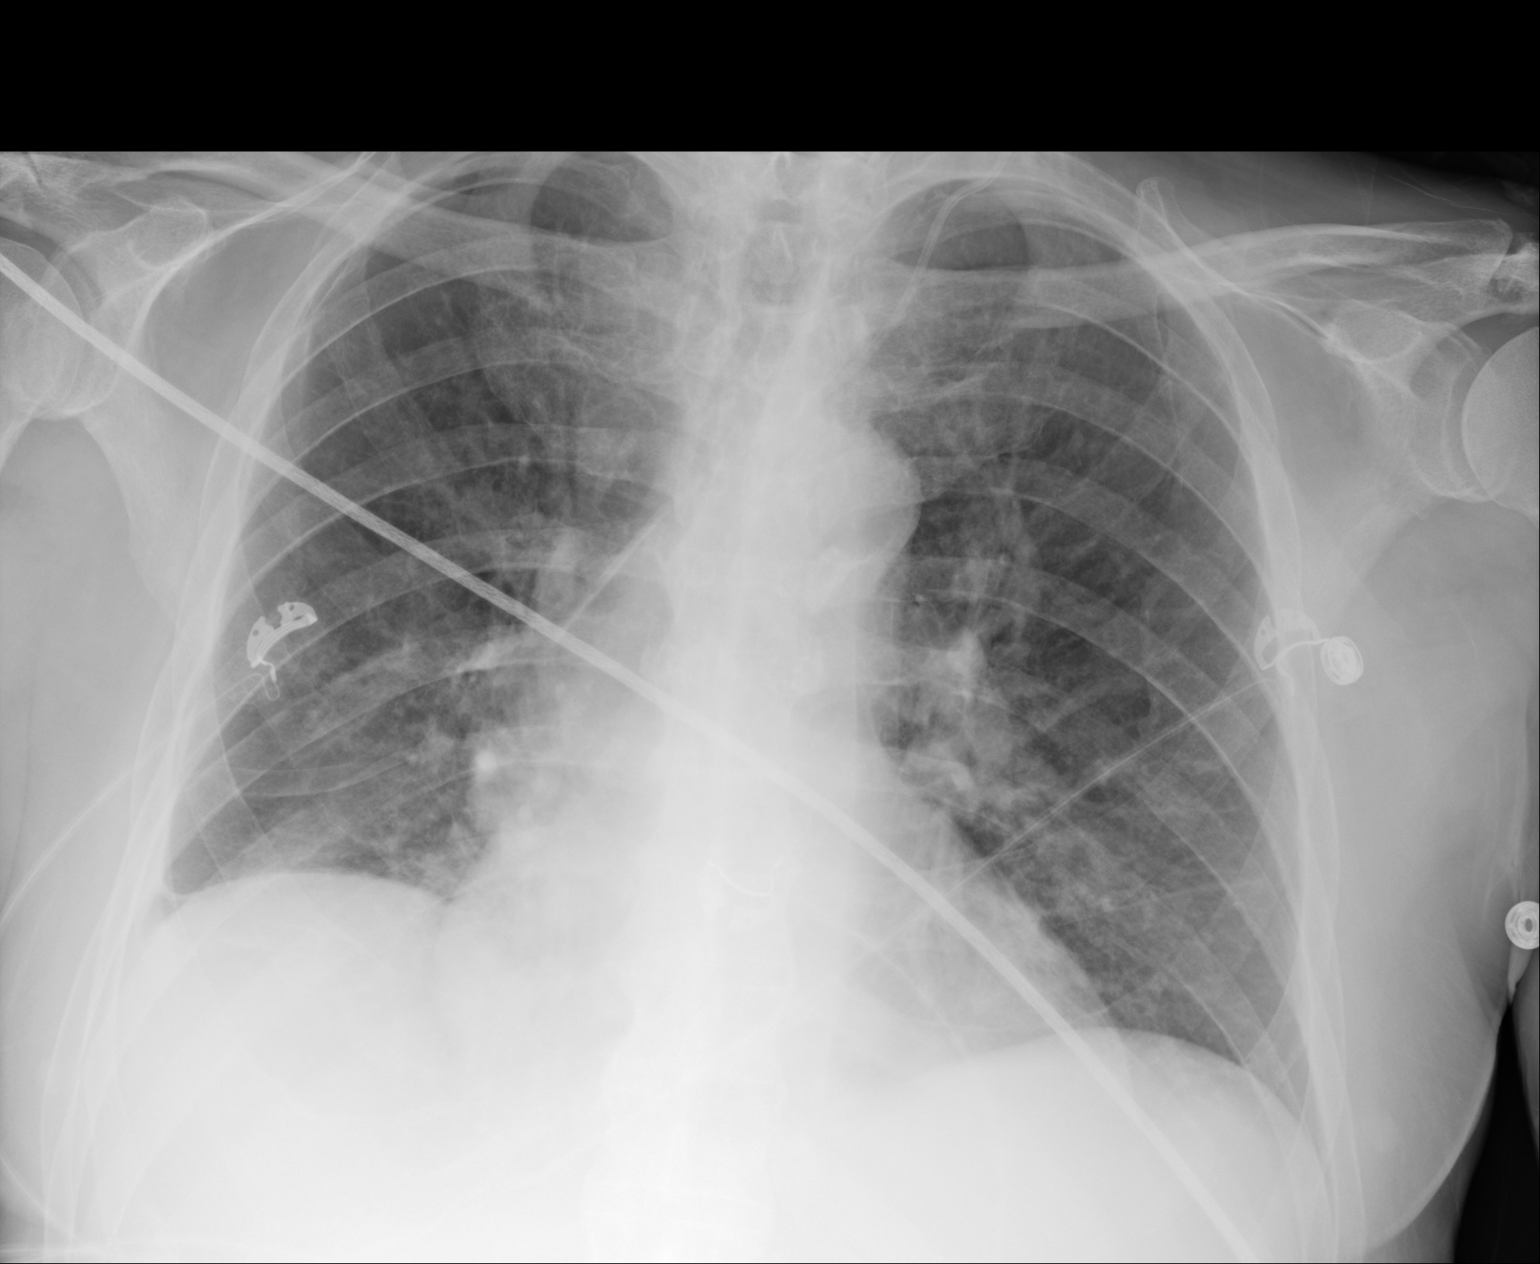

[1 of 1 positions shown; findings below may reference images not displayed]

FINDINGS: Left jugular central line is again noted in the mid superior vena
cava. Cardiac shadow remains mildly enlarged. Elevation the right
hemidiaphragm is seen. Mild right basilar atelectasis is noted. No
effusion or pneumothorax is seen.
IMPRESSION: Mild right basilar atelectasis.
